# Patient Record
Sex: Female | Born: 1950 | Race: White | Hispanic: No | Marital: Married | Smoking: Never smoker
Health system: Southern US, Community
[De-identification: ages and names within clinical notes are randomized; demographics above are authoritative.]

## PROBLEM LIST (undated history)

## (undated) DIAGNOSIS — R112 Nausea with vomiting, unspecified: Secondary | ICD-10-CM

## (undated) DIAGNOSIS — R7303 Prediabetes: Secondary | ICD-10-CM

## (undated) DIAGNOSIS — D649 Anemia, unspecified: Secondary | ICD-10-CM

## (undated) DIAGNOSIS — K219 Gastro-esophageal reflux disease without esophagitis: Secondary | ICD-10-CM

## (undated) DIAGNOSIS — F329 Major depressive disorder, single episode, unspecified: Secondary | ICD-10-CM

## (undated) DIAGNOSIS — Z9889 Other specified postprocedural states: Secondary | ICD-10-CM

## (undated) DIAGNOSIS — J45909 Unspecified asthma, uncomplicated: Secondary | ICD-10-CM

## (undated) DIAGNOSIS — I10 Essential (primary) hypertension: Secondary | ICD-10-CM

## (undated) DIAGNOSIS — R011 Cardiac murmur, unspecified: Secondary | ICD-10-CM

## (undated) DIAGNOSIS — F419 Anxiety disorder, unspecified: Secondary | ICD-10-CM

## (undated) DIAGNOSIS — F32A Depression, unspecified: Secondary | ICD-10-CM

## (undated) DIAGNOSIS — Z87442 Personal history of urinary calculi: Secondary | ICD-10-CM

## (undated) DIAGNOSIS — M1990 Unspecified osteoarthritis, unspecified site: Secondary | ICD-10-CM

## (undated) DIAGNOSIS — J189 Pneumonia, unspecified organism: Secondary | ICD-10-CM

## (undated) HISTORY — PX: JOINT REPLACEMENT: SHX530

## (undated) HISTORY — PX: OTHER SURGICAL HISTORY: SHX169

## (undated) HISTORY — PX: TONSILLECTOMY: SUR1361

## (undated) HISTORY — PX: WRIST ARTHROPLASTY: SHX1088

## (undated) HISTORY — PX: ABDOMINAL HYSTERECTOMY: SHX81

---

## 1999-02-28 ENCOUNTER — Inpatient Hospital Stay (HOSPITAL_COMMUNITY): Admission: EM | Admit: 1999-02-28 | Discharge: 1999-02-28 | Payer: Self-pay | Admitting: *Deleted

## 2000-03-07 DIAGNOSIS — C801 Malignant (primary) neoplasm, unspecified: Secondary | ICD-10-CM

## 2000-03-07 HISTORY — DX: Malignant (primary) neoplasm, unspecified: C80.1

## 2001-03-07 DIAGNOSIS — R112 Nausea with vomiting, unspecified: Secondary | ICD-10-CM

## 2001-03-07 DIAGNOSIS — Z9889 Other specified postprocedural states: Secondary | ICD-10-CM

## 2001-03-07 HISTORY — PX: FRACTURE SURGERY: SHX138

## 2001-03-07 HISTORY — DX: Other specified postprocedural states: Z98.890

## 2001-03-07 HISTORY — DX: Nausea with vomiting, unspecified: R11.2

## 2008-11-03 ENCOUNTER — Inpatient Hospital Stay (HOSPITAL_COMMUNITY): Admission: RE | Admit: 2008-11-03 | Discharge: 2008-11-06 | Payer: Self-pay | Admitting: Orthopedic Surgery

## 2008-11-04 ENCOUNTER — Encounter (INDEPENDENT_AMBULATORY_CARE_PROVIDER_SITE_OTHER): Payer: Self-pay | Admitting: Orthopedic Surgery

## 2008-11-04 ENCOUNTER — Ambulatory Visit: Payer: Self-pay | Admitting: Vascular Surgery

## 2010-06-11 LAB — CBC
HCT: 28.6 % — ABNORMAL LOW (ref 36.0–46.0)
Hemoglobin: 9.5 g/dL — ABNORMAL LOW (ref 12.0–15.0)
MCV: 83.8 fL (ref 78.0–100.0)
Platelets: 172 10*3/uL (ref 150–400)
Platelets: 187 10*3/uL (ref 150–400)
WBC: 10.7 10*3/uL — ABNORMAL HIGH (ref 4.0–10.5)
WBC: 9.2 10*3/uL (ref 4.0–10.5)

## 2010-06-11 LAB — GLUCOSE, CAPILLARY
Glucose-Capillary: 148 mg/dL — ABNORMAL HIGH (ref 70–99)
Glucose-Capillary: 212 mg/dL — ABNORMAL HIGH (ref 70–99)

## 2010-06-11 LAB — BASIC METABOLIC PANEL
BUN: 6 mg/dL (ref 6–23)
BUN: 8 mg/dL (ref 6–23)
Chloride: 100 mEq/L (ref 96–112)
Creatinine, Ser: 0.78 mg/dL (ref 0.4–1.2)
GFR calc non Af Amer: >60 mL/min (ref >60)
Glucose, Bld: 103 mg/dL — ABNORMAL HIGH (ref 70–99)
Potassium: 3.4 mEq/L — ABNORMAL LOW (ref 3.5–5.1)
Sodium: 132 mEq/L — ABNORMAL LOW (ref 135–145)

## 2010-06-12 LAB — CROSSMATCH: Antibody Screen: NEGATIVE

## 2010-06-12 LAB — COMPREHENSIVE METABOLIC PANEL
ALT: 12 U/L (ref 0–35)
AST: 20 U/L (ref 0–37)
Alkaline Phosphatase: 75 U/L (ref 39–117)
CO2: 26 mEq/L (ref 19–32)
Chloride: 105 mEq/L (ref 96–112)
GFR calc Af Amer: >60 mL/min (ref >60)
GFR calc non Af Amer: >60 mL/min (ref >60)
Glucose, Bld: 103 mg/dL — ABNORMAL HIGH (ref 70–99)
Potassium: 4.1 mEq/L (ref 3.5–5.1)
Sodium: 138 mEq/L (ref 135–145)
Total Bilirubin: 0.4 mg/dL (ref 0.3–1.2)

## 2010-06-12 LAB — URINALYSIS, ROUTINE W REFLEX MICROSCOPIC
Glucose, UA: NEGATIVE mg/dL
Hgb urine dipstick: NEGATIVE
Protein, ur: NEGATIVE mg/dL
Urobilinogen, UA: 0.2 mg/dL (ref 0.0–1.0)

## 2010-06-12 LAB — DIFFERENTIAL
Basophils Relative: 2 % — ABNORMAL HIGH (ref 0–1)
Eosinophils Absolute: 0.3 10*3/uL (ref 0.0–0.7)
Eosinophils Relative: 5 % (ref 0–5)
Neutrophils Relative %: 65 % (ref 43–77)

## 2010-06-12 LAB — CBC
HCT: 32 % — ABNORMAL LOW (ref 36.0–46.0)
Hemoglobin: 10.6 g/dL — ABNORMAL LOW (ref 12.0–15.0)
Hemoglobin: 12.3 g/dL (ref 12.0–15.0)
MCHC: 33.6 g/dL (ref 30.0–36.0)
MCV: 84 fL (ref 78.0–100.0)
Platelets: 186 10*3/uL (ref 150–400)
RBC: 4.38 MIL/uL (ref 3.87–5.11)
WBC: 11.7 10*3/uL — ABNORMAL HIGH (ref 4.0–10.5)
WBC: 7.1 10*3/uL (ref 4.0–10.5)

## 2010-06-12 LAB — GLUCOSE, CAPILLARY
Glucose-Capillary: 107 mg/dL — ABNORMAL HIGH (ref 70–99)
Glucose-Capillary: 108 mg/dL — ABNORMAL HIGH (ref 70–99)
Glucose-Capillary: 127 mg/dL — ABNORMAL HIGH (ref 70–99)

## 2010-06-12 LAB — PROTIME-INR
INR: 0.9 (ref 0.00–1.49)
Prothrombin Time: 12.2 seconds (ref 11.6–15.2)

## 2010-06-12 LAB — URINE CULTURE

## 2010-06-12 LAB — BASIC METABOLIC PANEL
BUN: 10 mg/dL (ref 6–23)
Chloride: 100 mEq/L (ref 96–112)
GFR calc non Af Amer: >60 mL/min (ref >60)
Glucose, Bld: 146 mg/dL — ABNORMAL HIGH (ref 70–99)
Potassium: 3.7 mEq/L (ref 3.5–5.1)
Sodium: 134 mEq/L — ABNORMAL LOW (ref 135–145)

## 2010-07-20 NOTE — Consult Note (Signed)
NAMEIQRA, [name]               ACCOUNT NO.:  [id number]   MEDICAL RECORD NO.:  [id number]          PATIENT TYPE:  INP   LOCATION:  5006                         FACILITY:  [facility]   PHYSICIAN:  Talmage Nap, MD  DATE OF BIRTH:  [birth year]   DATE OF CONSULTATION:  11/05/2008  DATE OF DISCHARGE:                                 CONSULTATION   REASON FOR CONSULTATION:  Altered mental status and hypoxia requested by  Dr. Sherlean Foot of orthopedics.   HISTORY OF PRESENT ILLNESS:  [name] is a 60 year old female with  past medical history of degenerative joint disease, hypertension,  depression, asthma, allergies, and diabetes type 2 diet controlled.  She  was status post left total knee arthroplasty on November 03, 2008.  Today,  she was noted to be confused and hypoxic with pox of 86% on room air.  A  CT angio of the chest has been ordered. Heather was consulted for further  evaluation and treatment.   ALLERGIES:  No known drug allergies.   PAST MEDICAL HISTORY:  1. Degenerative joint disease.  2. Hypertension.  3. Depression.  4. Asthma.  5. Allergies.  6. Diabetes type 2, diet controlled.   PAST SURGICAL HISTORY:  1. Tonsillectomy.  2. Eye surgery x3.   SOCIAL HISTORY:  She is married, she has no children. She reports never  smoked and very rare alcohol use.   FAMILY HISTORY:  Mother is deceased secondary to pneumonia.  She also  had history of asthma and heart arrhythmia.  Dad is deceased and had  mental health problems and also some respiratory problems.   MEDICATIONS:  1. Tylenol 1000 mg p.o. q.6 h.  2. Colace 100 mg p.o. b.Heather.d.  3. Doxepin 50 mg p.o. at bedtime.  4. Cymbalta 60 mg p.o. daily.  5. Lovenox 30 mg subcu q.12 h.  6. Lisinopril 5 mg p.o. at bedtime.  7. Loratadine 10 mg p.o. at bedtime.  8. Ativan 3 mg p.o. at bedtime and 1 mg p.o. daily in the morning.  9. Oxycodone 20 mg p.o. b.Heather.d.  10.Protonix 40 mg p.o. at bedtime.  11.Peridium 100 mg p.o. t.Heather.d.  12.Zocor 20 mg p.o. daily.  13.Aluminum hydroxide suspension 15-30 mL p.o. q.4 h. p.r.n.  14.Tessalon Perles 100 mg p.o. daily p.r.n.  15.Dulcolax 10 mg p.o. daily p.r.n.  16.Chloraseptic spray p.r.n.  17.Diphenhydramine 25 mg p.o. at bedtime p.r.n.  18.Guaifenesin 600 mg p.o. b.Heather.d. p.r.n.  19.Hydromorphone 0.5 to 1 mg IV q.2 h. p.r.n.  20.Cepacol 1 tab p.o. p.r.n.  21.Robaxin 500 mg p.o. q.6 h. p.r.n.  22.Reglan 10 mg p.o. or IV q.6 h. p.r.n.  23.Zofran 4 mg IV or p.o. q.6 h. p.r.n.  24.Oxycodone 5-10 mg two q.3 h. p.r.n.  25.Ambien 5 mg p.o. at bedtime p.r.n.  26.Fleet 133 mL daily p.r.n.   REVIEW OF SYSTEMS:  GENERAL:  No chills.  No sweats.  CARDIOVASCULAR:  No chest pain.  No palpitations.  RESPIRATORY:  Denies shortness of  breath or cough.  GI:  Nausea without vomiting.  GU:  Denies dysuria.  Currently on Cipro for  UTI.  MUSCULOSKELETAL:  Left knee notes pain.  PSYCHIATRIC:  She has history of depression and anxiety, which she feels  is well controlled with current medications.  NEURO:  She notes chronic  blurred vision in left eye which is currently at baseline.  SKIN:  No  rashes.  ENT:  Positive nasal congestion.  Does have soar throat since  surgery intubation.   LABORATORY DATA/RADIOLOGY:  Sodium 135, potassium 4.0, chloride 100,  bicarb 29, BUN 8, creatinine 0.78, glucose 141.  Hemoglobin 9.8,  hematocrit 38, white blood cell count 10.7, platelets 172.   PHYSICAL EXAMINATION:  VITAL SIGNS:  BP 112/72, heart rate 102,  respiratory rate 18, temp 99.2, O2 sat 86% on room air.  HEENT:  Head is normocephalic and atraumatic.  GENERAL:  Awake but drowsy.  No acute distress.  CARDIOVASCULAR:  S1 and S2.  Regular rate and rhythm.  No lower  extremity edema.  RESPIRATORY:  Breath sounds to auscultation bilaterally but diminished  at the bases.  GI:  Abdomen is soft, nontender, nondistended.  Positive bowel sounds  are noted.  NEURO:  She is moving all extremities.  She has  positive facial  symmetry.  Speech is clear.  MUSCULOSKELETAL:  Her left knee dressing is clean, dry, and intact.  She  is wearing bilateral TED hose.  PSYCHIATRIC:  She is oriented to person.  She is not oriented to place  or year.   ASSESSMENT AND PLAN:  1. Altered mental status with hypoxia, need to rule out pulmonary      embolism as well as possibility of underlying pneumonia.  CT angio      of the chest is currently pending.  We will start IV heparin      empirically with plan to discontinue if CT angio is negative.      Titrate O2 to keep sats greater than or equal to 92%.  We will plan      to change Ativan to p.r.n. secondary to sedation.  2. Hypertension.  Her blood pressure is stable.  Continue home meds.  3. 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  Hemoglobin is 12.3      preop, down to 9.8 postoperative and plan is to monitor hemoglobin      level.  4. Hyperglycemia.  The patient reports history of diet-controlled      diabetes type 2.  We will check A1c, add sliding scale coverage.  5-Thank you for the consult.      Sandford Craze, NP      Talmage Nap, MD  Electronically Signed    MO/MEDQ  D:  11/05/2008  T:  11/06/2008  Job:  [id number]   cc:   Heather Leach, PA-C

## 2010-07-20 NOTE — Op Note (Signed)
[name], [name]               ACCOUNT NO.:  [id number]   MEDICAL RECORD NO.:  [id number]          PATIENT TYPE:  INP   LOCATION:  5006                         FACILITY:  [facility]   PHYSICIAN:  Mila Homer. Sherlean Foot, M.D. DATE OF BIRTH:  Nov 18, 1950   DATE OF PROCEDURE:  11/03/2008  DATE OF DISCHARGE:                               OPERATIVE REPORT   SURGEON:  Mila Homer. Sherlean Foot, MD   ASSISTANTS:  Altamese Cabal, PA-C and Skip Mayer, PA-C   ANESTHESIA:  General.   PREOPERATIVE DIAGNOSIS:  Left knee osteoarthritis.   POSTOPERATIVE DIAGNOSIS:  Left knee osteoarthritis.   PROCEDURE:  Left total knee arthroplasty.   INDICATIONS FOR PROCEDURE:  The patient is a 60 year old white female  with failure of conservative measures for left knee osteoarthritis.  Informed consent was obtained.   DESCRIPTION OF PROCEDURE:  The patient was laid supine, administered  general anesthesia, and Foley catheter was placed.  Left leg was prepped  and draped in usual sterile fashion.  The extremity was exsanguinated  with the Esmarch and the tourniquet inflated to 350 mmHg and set for an  hour.  Midline incision was made with a #10 blade.  New blade was used  make a median parapatellar arthrotomy and performed a synovectomy.  I  everted the patella, it measured 21 mm thick.  I reamed down to 12 mm,  drilled through lug holes with the 32-mm template and with prosthetic  trial in place, I recreated the 21-mm thickness.  I removed the button,  went into flexion.  I used the extramedullary alignment system on the  tibia to make a perpendicular cut to the anatomic axis of the tibia.  I  then used the intramedullary guide on the femur to make a 6-degree  valgus cut since this was a varus knee.  I removed this cutting block  and drew out the epicondylar axis.  Posterior condylar angle measured 3  degrees.  I then sized to size C, pinned through the 3-degree external  rotation holes matching the posterior condylar  angle.  I then placed a  lamina spreader in the knee, removed the ACL, PCL, medial and lateral  menisci, posterior condylar osteophytes.  I then placed a 10-mm spacer  block in the knee and had good flexion/extension gap balance after  releasing the semimembranosus tendon.  I then finished the femur with a  size C femoral finishing block, cutting through the lugs and the box.  I  then used a size 3 tibial tray drilling keel.  I then trialed with a 3  tibia C femur, 10 Insert, 32 patella, and had good flexion/extension gap  balance and excellent patellar tracking.  I then removed the trial  components and copiously irrigated.  I then cemented in the components  removing all excess cement and allowing the cement to harden in  extension.  I placed a Hemovac coming out superolaterally and deep to  the arthrotomy, and a pain catheter coming out superomedially and  superficially to the arthrotomy.  Infiltrated the capsule with 0.25%  Marcaine with epinephrine.  Closed the arthrotomy  after obtaining  hemostasis and irrigating.  I closed the arthrotomy with figure-of-eight  #1 Vicryl sutures, deep soft tissue with buried with 0 Vicryl sutures,  subcuticular 2-0 Vicryl stitch and skin staples.   COMPLICATIONS:  None.   DRAINS:  One Hemovac, one pain catheter.   ESTIMATED BLOOD LOSS:  300 mL.   TOURNIQUET TIME:  48 minutes.           ______________________________  Mila Homer Sherlean Foot, M.D.     SDL/MEDQ  D:  11/03/2008  T:  11/04/2008  Job:  [id number]

## 2011-04-20 DIAGNOSIS — M79609 Pain in unspecified limb: Secondary | ICD-10-CM | POA: Diagnosis not present

## 2011-04-20 DIAGNOSIS — M19049 Primary osteoarthritis, unspecified hand: Secondary | ICD-10-CM | POA: Diagnosis not present

## 2011-04-20 DIAGNOSIS — G5600 Carpal tunnel syndrome, unspecified upper limb: Secondary | ICD-10-CM | POA: Diagnosis not present

## 2011-04-20 DIAGNOSIS — R209 Unspecified disturbances of skin sensation: Secondary | ICD-10-CM | POA: Diagnosis not present

## 2011-04-21 DIAGNOSIS — Z79899 Other long term (current) drug therapy: Secondary | ICD-10-CM | POA: Diagnosis not present

## 2011-04-21 DIAGNOSIS — E7800 Pure hypercholesterolemia, unspecified: Secondary | ICD-10-CM | POA: Diagnosis not present

## 2011-04-21 DIAGNOSIS — E119 Type 2 diabetes mellitus without complications: Secondary | ICD-10-CM | POA: Diagnosis not present

## 2011-05-24 DIAGNOSIS — M84369A Stress fracture, unspecified tibia and fibula, initial encounter for fracture: Secondary | ICD-10-CM | POA: Diagnosis not present

## 2011-05-24 DIAGNOSIS — M949 Disorder of cartilage, unspecified: Secondary | ICD-10-CM | POA: Diagnosis not present

## 2011-05-24 DIAGNOSIS — R269 Unspecified abnormalities of gait and mobility: Secondary | ICD-10-CM | POA: Diagnosis not present

## 2011-05-24 DIAGNOSIS — M899 Disorder of bone, unspecified: Secondary | ICD-10-CM | POA: Diagnosis not present

## 2011-05-24 DIAGNOSIS — M25579 Pain in unspecified ankle and joints of unspecified foot: Secondary | ICD-10-CM | POA: Diagnosis not present

## 2011-06-06 DIAGNOSIS — F333 Major depressive disorder, recurrent, severe with psychotic symptoms: Secondary | ICD-10-CM | POA: Diagnosis not present

## 2011-06-10 DIAGNOSIS — M84369A Stress fracture, unspecified tibia and fibula, initial encounter for fracture: Secondary | ICD-10-CM | POA: Diagnosis not present

## 2011-06-16 DIAGNOSIS — M84369A Stress fracture, unspecified tibia and fibula, initial encounter for fracture: Secondary | ICD-10-CM | POA: Diagnosis not present

## 2011-06-21 DIAGNOSIS — N3941 Urge incontinence: Secondary | ICD-10-CM | POA: Diagnosis not present

## 2011-06-21 DIAGNOSIS — N318 Other neuromuscular dysfunction of bladder: Secondary | ICD-10-CM | POA: Diagnosis not present

## 2011-06-21 DIAGNOSIS — N390 Urinary tract infection, site not specified: Secondary | ICD-10-CM | POA: Diagnosis not present

## 2011-06-27 DIAGNOSIS — J45901 Unspecified asthma with (acute) exacerbation: Secondary | ICD-10-CM | POA: Diagnosis not present

## 2011-06-27 DIAGNOSIS — R509 Fever, unspecified: Secondary | ICD-10-CM | POA: Diagnosis not present

## 2011-07-01 DIAGNOSIS — M899 Disorder of bone, unspecified: Secondary | ICD-10-CM | POA: Diagnosis not present

## 2011-07-01 DIAGNOSIS — M949 Disorder of cartilage, unspecified: Secondary | ICD-10-CM | POA: Diagnosis not present

## 2011-07-01 DIAGNOSIS — M810 Age-related osteoporosis without current pathological fracture: Secondary | ICD-10-CM | POA: Diagnosis not present

## 2011-07-07 DIAGNOSIS — Z1231 Encounter for screening mammogram for malignant neoplasm of breast: Secondary | ICD-10-CM | POA: Diagnosis not present

## 2011-07-08 DIAGNOSIS — M84369A Stress fracture, unspecified tibia and fibula, initial encounter for fracture: Secondary | ICD-10-CM | POA: Diagnosis not present

## 2011-07-08 DIAGNOSIS — M25579 Pain in unspecified ankle and joints of unspecified foot: Secondary | ICD-10-CM | POA: Diagnosis not present

## 2011-07-08 DIAGNOSIS — R269 Unspecified abnormalities of gait and mobility: Secondary | ICD-10-CM | POA: Diagnosis not present

## 2011-07-12 DIAGNOSIS — J209 Acute bronchitis, unspecified: Secondary | ICD-10-CM | POA: Diagnosis not present

## 2011-07-21 DIAGNOSIS — M810 Age-related osteoporosis without current pathological fracture: Secondary | ICD-10-CM | POA: Diagnosis not present

## 2011-07-26 DIAGNOSIS — F333 Major depressive disorder, recurrent, severe with psychotic symptoms: Secondary | ICD-10-CM | POA: Diagnosis not present

## 2011-08-02 DIAGNOSIS — M899 Disorder of bone, unspecified: Secondary | ICD-10-CM | POA: Diagnosis not present

## 2011-08-02 DIAGNOSIS — M949 Disorder of cartilage, unspecified: Secondary | ICD-10-CM | POA: Diagnosis not present

## 2011-08-11 DIAGNOSIS — R5381 Other malaise: Secondary | ICD-10-CM | POA: Diagnosis not present

## 2011-08-11 DIAGNOSIS — J309 Allergic rhinitis, unspecified: Secondary | ICD-10-CM | POA: Diagnosis not present

## 2011-08-11 DIAGNOSIS — R509 Fever, unspecified: Secondary | ICD-10-CM | POA: Diagnosis not present

## 2011-08-11 DIAGNOSIS — R5383 Other fatigue: Secondary | ICD-10-CM | POA: Diagnosis not present

## 2011-08-15 DIAGNOSIS — J019 Acute sinusitis, unspecified: Secondary | ICD-10-CM | POA: Diagnosis not present

## 2011-09-21 DIAGNOSIS — J45909 Unspecified asthma, uncomplicated: Secondary | ICD-10-CM | POA: Diagnosis not present

## 2011-09-21 DIAGNOSIS — J309 Allergic rhinitis, unspecified: Secondary | ICD-10-CM | POA: Diagnosis not present

## 2011-09-21 DIAGNOSIS — K146 Glossodynia: Secondary | ICD-10-CM | POA: Diagnosis not present

## 2011-09-29 DIAGNOSIS — N390 Urinary tract infection, site not specified: Secondary | ICD-10-CM | POA: Diagnosis not present

## 2011-09-29 DIAGNOSIS — N3941 Urge incontinence: Secondary | ICD-10-CM | POA: Diagnosis not present

## 2011-09-29 DIAGNOSIS — N318 Other neuromuscular dysfunction of bladder: Secondary | ICD-10-CM | POA: Diagnosis not present

## 2011-10-13 DIAGNOSIS — L719 Rosacea, unspecified: Secondary | ICD-10-CM | POA: Diagnosis not present

## 2011-10-13 DIAGNOSIS — L219 Seborrheic dermatitis, unspecified: Secondary | ICD-10-CM | POA: Diagnosis not present

## 2011-10-13 DIAGNOSIS — L570 Actinic keratosis: Secondary | ICD-10-CM | POA: Diagnosis not present

## 2011-10-25 DIAGNOSIS — F333 Major depressive disorder, recurrent, severe with psychotic symptoms: Secondary | ICD-10-CM | POA: Diagnosis not present

## 2011-10-31 DIAGNOSIS — J309 Allergic rhinitis, unspecified: Secondary | ICD-10-CM | POA: Diagnosis not present

## 2011-11-04 DIAGNOSIS — M5137 Other intervertebral disc degeneration, lumbosacral region: Secondary | ICD-10-CM | POA: Diagnosis not present

## 2011-11-04 DIAGNOSIS — M25569 Pain in unspecified knee: Secondary | ICD-10-CM | POA: Diagnosis not present

## 2011-11-04 DIAGNOSIS — Z96659 Presence of unspecified artificial knee joint: Secondary | ICD-10-CM | POA: Diagnosis not present

## 2011-11-04 DIAGNOSIS — IMO0002 Reserved for concepts with insufficient information to code with codable children: Secondary | ICD-10-CM | POA: Diagnosis not present

## 2011-11-17 DIAGNOSIS — J45909 Unspecified asthma, uncomplicated: Secondary | ICD-10-CM | POA: Diagnosis not present

## 2011-11-17 DIAGNOSIS — H1045 Other chronic allergic conjunctivitis: Secondary | ICD-10-CM | POA: Diagnosis not present

## 2011-11-17 DIAGNOSIS — J309 Allergic rhinitis, unspecified: Secondary | ICD-10-CM | POA: Diagnosis not present

## 2011-12-06 DIAGNOSIS — Z23 Encounter for immunization: Secondary | ICD-10-CM | POA: Diagnosis not present

## 2011-12-16 DIAGNOSIS — Z96659 Presence of unspecified artificial knee joint: Secondary | ICD-10-CM | POA: Diagnosis not present

## 2011-12-16 DIAGNOSIS — M25569 Pain in unspecified knee: Secondary | ICD-10-CM | POA: Diagnosis not present

## 2012-01-09 DIAGNOSIS — L981 Factitial dermatitis: Secondary | ICD-10-CM | POA: Diagnosis not present

## 2012-01-09 DIAGNOSIS — L219 Seborrheic dermatitis, unspecified: Secondary | ICD-10-CM | POA: Diagnosis not present

## 2012-02-24 DIAGNOSIS — I10 Essential (primary) hypertension: Secondary | ICD-10-CM | POA: Diagnosis not present

## 2012-02-24 DIAGNOSIS — E782 Mixed hyperlipidemia: Secondary | ICD-10-CM | POA: Diagnosis not present

## 2012-02-24 DIAGNOSIS — N183 Chronic kidney disease, stage 3 unspecified: Secondary | ICD-10-CM | POA: Diagnosis not present

## 2012-02-24 DIAGNOSIS — E119 Type 2 diabetes mellitus without complications: Secondary | ICD-10-CM | POA: Diagnosis not present

## 2012-03-12 DIAGNOSIS — L219 Seborrheic dermatitis, unspecified: Secondary | ICD-10-CM | POA: Diagnosis not present

## 2012-03-12 DIAGNOSIS — L981 Factitial dermatitis: Secondary | ICD-10-CM | POA: Diagnosis not present

## 2012-03-20 DIAGNOSIS — M75100 Unspecified rotator cuff tear or rupture of unspecified shoulder, not specified as traumatic: Secondary | ICD-10-CM | POA: Diagnosis not present

## 2012-03-20 DIAGNOSIS — M25519 Pain in unspecified shoulder: Secondary | ICD-10-CM | POA: Diagnosis not present

## 2012-03-20 DIAGNOSIS — IMO0002 Reserved for concepts with insufficient information to code with codable children: Secondary | ICD-10-CM | POA: Diagnosis not present

## 2012-03-25 DIAGNOSIS — J40 Bronchitis, not specified as acute or chronic: Secondary | ICD-10-CM | POA: Diagnosis not present

## 2012-03-25 DIAGNOSIS — J45909 Unspecified asthma, uncomplicated: Secondary | ICD-10-CM | POA: Diagnosis not present

## 2012-03-28 DIAGNOSIS — J209 Acute bronchitis, unspecified: Secondary | ICD-10-CM | POA: Diagnosis not present

## 2012-04-12 DIAGNOSIS — L408 Other psoriasis: Secondary | ICD-10-CM | POA: Diagnosis not present

## 2012-04-12 DIAGNOSIS — L4050 Arthropathic psoriasis, unspecified: Secondary | ICD-10-CM | POA: Diagnosis not present

## 2012-04-17 DIAGNOSIS — L439 Lichen planus, unspecified: Secondary | ICD-10-CM | POA: Diagnosis not present

## 2012-04-17 DIAGNOSIS — Z111 Encounter for screening for respiratory tuberculosis: Secondary | ICD-10-CM | POA: Diagnosis not present

## 2012-05-09 DIAGNOSIS — F333 Major depressive disorder, recurrent, severe with psychotic symptoms: Secondary | ICD-10-CM | POA: Diagnosis not present

## 2012-05-24 DIAGNOSIS — S92309A Fracture of unspecified metatarsal bone(s), unspecified foot, initial encounter for closed fracture: Secondary | ICD-10-CM | POA: Diagnosis not present

## 2012-05-31 DIAGNOSIS — S92309A Fracture of unspecified metatarsal bone(s), unspecified foot, initial encounter for closed fracture: Secondary | ICD-10-CM | POA: Diagnosis not present

## 2012-06-06 DIAGNOSIS — L408 Other psoriasis: Secondary | ICD-10-CM | POA: Diagnosis not present

## 2012-06-08 DIAGNOSIS — S92309A Fracture of unspecified metatarsal bone(s), unspecified foot, initial encounter for closed fracture: Secondary | ICD-10-CM | POA: Diagnosis not present

## 2012-06-29 DIAGNOSIS — S92309A Fracture of unspecified metatarsal bone(s), unspecified foot, initial encounter for closed fracture: Secondary | ICD-10-CM | POA: Diagnosis not present

## 2012-07-16 DIAGNOSIS — Z9181 History of falling: Secondary | ICD-10-CM | POA: Diagnosis not present

## 2012-07-16 DIAGNOSIS — Z6835 Body mass index (BMI) 35.0-35.9, adult: Secondary | ICD-10-CM | POA: Diagnosis not present

## 2012-07-16 DIAGNOSIS — Z1331 Encounter for screening for depression: Secondary | ICD-10-CM | POA: Diagnosis not present

## 2012-07-16 DIAGNOSIS — L03019 Cellulitis of unspecified finger: Secondary | ICD-10-CM | POA: Diagnosis not present

## 2012-07-18 DIAGNOSIS — L4050 Arthropathic psoriasis, unspecified: Secondary | ICD-10-CM | POA: Diagnosis not present

## 2012-07-18 DIAGNOSIS — L408 Other psoriasis: Secondary | ICD-10-CM | POA: Diagnosis not present

## 2012-07-20 DIAGNOSIS — Z1231 Encounter for screening mammogram for malignant neoplasm of breast: Secondary | ICD-10-CM | POA: Diagnosis not present

## 2012-07-23 DIAGNOSIS — Z0000 Encounter for general adult medical examination without abnormal findings: Secondary | ICD-10-CM | POA: Diagnosis not present

## 2012-07-23 DIAGNOSIS — I10 Essential (primary) hypertension: Secondary | ICD-10-CM | POA: Diagnosis not present

## 2012-07-23 DIAGNOSIS — E119 Type 2 diabetes mellitus without complications: Secondary | ICD-10-CM | POA: Diagnosis not present

## 2012-07-23 DIAGNOSIS — Z6836 Body mass index (BMI) 36.0-36.9, adult: Secondary | ICD-10-CM | POA: Diagnosis not present

## 2012-07-23 DIAGNOSIS — E782 Mixed hyperlipidemia: Secondary | ICD-10-CM | POA: Diagnosis not present

## 2012-07-25 DIAGNOSIS — M545 Low back pain, unspecified: Secondary | ICD-10-CM | POA: Diagnosis not present

## 2012-07-27 DIAGNOSIS — S92309A Fracture of unspecified metatarsal bone(s), unspecified foot, initial encounter for closed fracture: Secondary | ICD-10-CM | POA: Diagnosis not present

## 2012-08-09 DIAGNOSIS — L02818 Cutaneous abscess of other sites: Secondary | ICD-10-CM | POA: Diagnosis not present

## 2012-08-24 DIAGNOSIS — M899 Disorder of bone, unspecified: Secondary | ICD-10-CM | POA: Diagnosis not present

## 2012-08-31 DIAGNOSIS — M25579 Pain in unspecified ankle and joints of unspecified foot: Secondary | ICD-10-CM | POA: Diagnosis not present

## 2012-08-31 DIAGNOSIS — S92309A Fracture of unspecified metatarsal bone(s), unspecified foot, initial encounter for closed fracture: Secondary | ICD-10-CM | POA: Diagnosis not present

## 2012-09-17 DIAGNOSIS — L570 Actinic keratosis: Secondary | ICD-10-CM | POA: Diagnosis not present

## 2012-09-17 DIAGNOSIS — L408 Other psoriasis: Secondary | ICD-10-CM | POA: Diagnosis not present

## 2012-09-20 DIAGNOSIS — J45909 Unspecified asthma, uncomplicated: Secondary | ICD-10-CM | POA: Diagnosis not present

## 2012-09-20 DIAGNOSIS — J309 Allergic rhinitis, unspecified: Secondary | ICD-10-CM | POA: Diagnosis not present

## 2012-09-20 DIAGNOSIS — M25579 Pain in unspecified ankle and joints of unspecified foot: Secondary | ICD-10-CM | POA: Diagnosis not present

## 2012-09-25 DIAGNOSIS — R05 Cough: Secondary | ICD-10-CM | POA: Diagnosis not present

## 2012-09-25 DIAGNOSIS — R059 Cough, unspecified: Secondary | ICD-10-CM | POA: Diagnosis not present

## 2012-10-08 DIAGNOSIS — M25673 Stiffness of unspecified ankle, not elsewhere classified: Secondary | ICD-10-CM | POA: Diagnosis not present

## 2012-10-12 DIAGNOSIS — M25676 Stiffness of unspecified foot, not elsewhere classified: Secondary | ICD-10-CM | POA: Diagnosis not present

## 2012-10-12 DIAGNOSIS — M25673 Stiffness of unspecified ankle, not elsewhere classified: Secondary | ICD-10-CM | POA: Diagnosis not present

## 2012-10-16 DIAGNOSIS — J209 Acute bronchitis, unspecified: Secondary | ICD-10-CM | POA: Diagnosis not present

## 2012-10-19 DIAGNOSIS — J209 Acute bronchitis, unspecified: Secondary | ICD-10-CM | POA: Diagnosis not present

## 2012-10-23 DIAGNOSIS — J019 Acute sinusitis, unspecified: Secondary | ICD-10-CM | POA: Diagnosis not present

## 2012-10-31 DIAGNOSIS — J45909 Unspecified asthma, uncomplicated: Secondary | ICD-10-CM | POA: Diagnosis not present

## 2012-10-31 DIAGNOSIS — J309 Allergic rhinitis, unspecified: Secondary | ICD-10-CM | POA: Diagnosis not present

## 2012-11-06 DIAGNOSIS — F333 Major depressive disorder, recurrent, severe with psychotic symptoms: Secondary | ICD-10-CM | POA: Diagnosis not present

## 2012-11-19 DIAGNOSIS — N3941 Urge incontinence: Secondary | ICD-10-CM | POA: Diagnosis not present

## 2012-11-19 DIAGNOSIS — J449 Chronic obstructive pulmonary disease, unspecified: Secondary | ICD-10-CM | POA: Diagnosis not present

## 2012-11-19 DIAGNOSIS — I10 Essential (primary) hypertension: Secondary | ICD-10-CM | POA: Diagnosis not present

## 2012-11-19 DIAGNOSIS — N318 Other neuromuscular dysfunction of bladder: Secondary | ICD-10-CM | POA: Diagnosis not present

## 2012-11-29 DIAGNOSIS — I10 Essential (primary) hypertension: Secondary | ICD-10-CM | POA: Diagnosis not present

## 2012-11-29 DIAGNOSIS — IMO0001 Reserved for inherently not codable concepts without codable children: Secondary | ICD-10-CM | POA: Diagnosis not present

## 2012-11-29 DIAGNOSIS — E782 Mixed hyperlipidemia: Secondary | ICD-10-CM | POA: Diagnosis not present

## 2012-11-29 DIAGNOSIS — E119 Type 2 diabetes mellitus without complications: Secondary | ICD-10-CM | POA: Diagnosis not present

## 2012-12-19 DIAGNOSIS — J309 Allergic rhinitis, unspecified: Secondary | ICD-10-CM | POA: Diagnosis not present

## 2012-12-19 DIAGNOSIS — J45909 Unspecified asthma, uncomplicated: Secondary | ICD-10-CM | POA: Diagnosis not present

## 2012-12-24 DIAGNOSIS — B372 Candidiasis of skin and nail: Secondary | ICD-10-CM | POA: Diagnosis not present

## 2012-12-28 DIAGNOSIS — M25519 Pain in unspecified shoulder: Secondary | ICD-10-CM | POA: Diagnosis not present

## 2013-01-01 DIAGNOSIS — N390 Urinary tract infection, site not specified: Secondary | ICD-10-CM | POA: Diagnosis not present

## 2013-01-01 DIAGNOSIS — J449 Chronic obstructive pulmonary disease, unspecified: Secondary | ICD-10-CM | POA: Diagnosis not present

## 2013-01-01 DIAGNOSIS — N318 Other neuromuscular dysfunction of bladder: Secondary | ICD-10-CM | POA: Diagnosis not present

## 2013-01-01 DIAGNOSIS — N3941 Urge incontinence: Secondary | ICD-10-CM | POA: Diagnosis not present

## 2013-01-03 DIAGNOSIS — N390 Urinary tract infection, site not specified: Secondary | ICD-10-CM | POA: Diagnosis not present

## 2013-01-10 DIAGNOSIS — J449 Chronic obstructive pulmonary disease, unspecified: Secondary | ICD-10-CM | POA: Diagnosis not present

## 2013-01-10 DIAGNOSIS — I10 Essential (primary) hypertension: Secondary | ICD-10-CM | POA: Diagnosis not present

## 2013-01-10 DIAGNOSIS — N390 Urinary tract infection, site not specified: Secondary | ICD-10-CM | POA: Diagnosis not present

## 2013-01-10 DIAGNOSIS — N3941 Urge incontinence: Secondary | ICD-10-CM | POA: Diagnosis not present

## 2013-02-18 DIAGNOSIS — N390 Urinary tract infection, site not specified: Secondary | ICD-10-CM | POA: Diagnosis not present

## 2013-02-18 DIAGNOSIS — N3941 Urge incontinence: Secondary | ICD-10-CM | POA: Diagnosis not present

## 2013-02-18 DIAGNOSIS — N318 Other neuromuscular dysfunction of bladder: Secondary | ICD-10-CM | POA: Diagnosis not present

## 2013-02-19 DIAGNOSIS — J209 Acute bronchitis, unspecified: Secondary | ICD-10-CM | POA: Diagnosis not present

## 2013-02-19 DIAGNOSIS — J019 Acute sinusitis, unspecified: Secondary | ICD-10-CM | POA: Diagnosis not present

## 2013-03-11 DIAGNOSIS — Z6834 Body mass index (BMI) 34.0-34.9, adult: Secondary | ICD-10-CM | POA: Diagnosis not present

## 2013-03-11 DIAGNOSIS — J45909 Unspecified asthma, uncomplicated: Secondary | ICD-10-CM | POA: Diagnosis not present

## 2013-03-19 DIAGNOSIS — N3941 Urge incontinence: Secondary | ICD-10-CM | POA: Diagnosis not present

## 2013-03-19 DIAGNOSIS — N390 Urinary tract infection, site not specified: Secondary | ICD-10-CM | POA: Diagnosis not present

## 2013-03-19 DIAGNOSIS — N318 Other neuromuscular dysfunction of bladder: Secondary | ICD-10-CM | POA: Diagnosis not present

## 2013-03-25 DIAGNOSIS — N3941 Urge incontinence: Secondary | ICD-10-CM | POA: Diagnosis not present

## 2013-03-25 DIAGNOSIS — I10 Essential (primary) hypertension: Secondary | ICD-10-CM | POA: Diagnosis not present

## 2013-03-25 DIAGNOSIS — J449 Chronic obstructive pulmonary disease, unspecified: Secondary | ICD-10-CM | POA: Diagnosis not present

## 2013-03-25 DIAGNOSIS — N390 Urinary tract infection, site not specified: Secondary | ICD-10-CM | POA: Diagnosis not present

## 2013-03-26 DIAGNOSIS — M899 Disorder of bone, unspecified: Secondary | ICD-10-CM | POA: Diagnosis not present

## 2013-03-26 DIAGNOSIS — M949 Disorder of cartilage, unspecified: Secondary | ICD-10-CM | POA: Diagnosis not present

## 2013-03-28 DIAGNOSIS — J019 Acute sinusitis, unspecified: Secondary | ICD-10-CM | POA: Diagnosis not present

## 2013-04-24 DIAGNOSIS — S82409A Unspecified fracture of shaft of unspecified fibula, initial encounter for closed fracture: Secondary | ICD-10-CM | POA: Diagnosis not present

## 2013-05-09 DIAGNOSIS — S82409A Unspecified fracture of shaft of unspecified fibula, initial encounter for closed fracture: Secondary | ICD-10-CM | POA: Diagnosis not present

## 2013-05-21 DIAGNOSIS — M2020 Hallux rigidus, unspecified foot: Secondary | ICD-10-CM | POA: Diagnosis not present

## 2013-05-21 DIAGNOSIS — B351 Tinea unguium: Secondary | ICD-10-CM | POA: Diagnosis not present

## 2013-05-21 DIAGNOSIS — L608 Other nail disorders: Secondary | ICD-10-CM | POA: Diagnosis not present

## 2013-05-27 DIAGNOSIS — E119 Type 2 diabetes mellitus without complications: Secondary | ICD-10-CM | POA: Diagnosis not present

## 2013-05-27 DIAGNOSIS — I10 Essential (primary) hypertension: Secondary | ICD-10-CM | POA: Diagnosis not present

## 2013-05-27 DIAGNOSIS — E782 Mixed hyperlipidemia: Secondary | ICD-10-CM | POA: Diagnosis not present

## 2013-05-30 DIAGNOSIS — L4050 Arthropathic psoriasis, unspecified: Secondary | ICD-10-CM | POA: Diagnosis not present

## 2013-05-30 DIAGNOSIS — L408 Other psoriasis: Secondary | ICD-10-CM | POA: Diagnosis not present

## 2013-06-10 DIAGNOSIS — S82409A Unspecified fracture of shaft of unspecified fibula, initial encounter for closed fracture: Secondary | ICD-10-CM | POA: Diagnosis not present

## 2013-06-11 DIAGNOSIS — M79609 Pain in unspecified limb: Secondary | ICD-10-CM | POA: Diagnosis not present

## 2013-06-11 DIAGNOSIS — L608 Other nail disorders: Secondary | ICD-10-CM | POA: Diagnosis not present

## 2013-06-13 DIAGNOSIS — N390 Urinary tract infection, site not specified: Secondary | ICD-10-CM | POA: Diagnosis not present

## 2013-06-13 DIAGNOSIS — N393 Stress incontinence (female) (male): Secondary | ICD-10-CM | POA: Diagnosis not present

## 2013-06-13 DIAGNOSIS — N3941 Urge incontinence: Secondary | ICD-10-CM | POA: Diagnosis not present

## 2013-06-13 DIAGNOSIS — N318 Other neuromuscular dysfunction of bladder: Secondary | ICD-10-CM | POA: Diagnosis not present

## 2013-06-17 DIAGNOSIS — F333 Major depressive disorder, recurrent, severe with psychotic symptoms: Secondary | ICD-10-CM | POA: Diagnosis not present

## 2013-06-20 DIAGNOSIS — J45909 Unspecified asthma, uncomplicated: Secondary | ICD-10-CM | POA: Diagnosis not present

## 2013-06-20 DIAGNOSIS — J309 Allergic rhinitis, unspecified: Secondary | ICD-10-CM | POA: Diagnosis not present

## 2013-06-24 DIAGNOSIS — N390 Urinary tract infection, site not specified: Secondary | ICD-10-CM | POA: Diagnosis not present

## 2013-06-24 DIAGNOSIS — N3941 Urge incontinence: Secondary | ICD-10-CM | POA: Diagnosis not present

## 2013-06-24 DIAGNOSIS — N318 Other neuromuscular dysfunction of bladder: Secondary | ICD-10-CM | POA: Diagnosis not present

## 2013-06-24 DIAGNOSIS — N393 Stress incontinence (female) (male): Secondary | ICD-10-CM | POA: Diagnosis not present

## 2013-06-26 DIAGNOSIS — I10 Essential (primary) hypertension: Secondary | ICD-10-CM | POA: Diagnosis not present

## 2013-06-26 DIAGNOSIS — N393 Stress incontinence (female) (male): Secondary | ICD-10-CM | POA: Diagnosis not present

## 2013-06-26 DIAGNOSIS — J45909 Unspecified asthma, uncomplicated: Secondary | ICD-10-CM | POA: Diagnosis not present

## 2013-06-26 DIAGNOSIS — Z79899 Other long term (current) drug therapy: Secondary | ICD-10-CM | POA: Diagnosis not present

## 2013-06-26 DIAGNOSIS — K219 Gastro-esophageal reflux disease without esophagitis: Secondary | ICD-10-CM | POA: Diagnosis not present

## 2013-07-04 DIAGNOSIS — N318 Other neuromuscular dysfunction of bladder: Secondary | ICD-10-CM | POA: Diagnosis not present

## 2013-07-04 DIAGNOSIS — N390 Urinary tract infection, site not specified: Secondary | ICD-10-CM | POA: Diagnosis not present

## 2013-07-04 DIAGNOSIS — N393 Stress incontinence (female) (male): Secondary | ICD-10-CM | POA: Diagnosis not present

## 2013-07-04 DIAGNOSIS — N3941 Urge incontinence: Secondary | ICD-10-CM | POA: Diagnosis not present

## 2013-07-17 DIAGNOSIS — J45901 Unspecified asthma with (acute) exacerbation: Secondary | ICD-10-CM | POA: Diagnosis not present

## 2013-07-17 DIAGNOSIS — J019 Acute sinusitis, unspecified: Secondary | ICD-10-CM | POA: Diagnosis not present

## 2013-07-24 DIAGNOSIS — Z1231 Encounter for screening mammogram for malignant neoplasm of breast: Secondary | ICD-10-CM | POA: Diagnosis not present

## 2013-07-24 DIAGNOSIS — M949 Disorder of cartilage, unspecified: Secondary | ICD-10-CM | POA: Diagnosis not present

## 2013-07-24 DIAGNOSIS — M899 Disorder of bone, unspecified: Secondary | ICD-10-CM | POA: Diagnosis not present

## 2013-08-07 DIAGNOSIS — M19049 Primary osteoarthritis, unspecified hand: Secondary | ICD-10-CM | POA: Diagnosis not present

## 2013-08-10 DIAGNOSIS — L570 Actinic keratosis: Secondary | ICD-10-CM | POA: Diagnosis not present

## 2013-08-12 DIAGNOSIS — L4050 Arthropathic psoriasis, unspecified: Secondary | ICD-10-CM | POA: Diagnosis not present

## 2013-09-10 DIAGNOSIS — L608 Other nail disorders: Secondary | ICD-10-CM | POA: Diagnosis not present

## 2013-09-20 DIAGNOSIS — M949 Disorder of cartilage, unspecified: Secondary | ICD-10-CM | POA: Diagnosis not present

## 2013-09-20 DIAGNOSIS — M899 Disorder of bone, unspecified: Secondary | ICD-10-CM | POA: Diagnosis not present

## 2013-09-23 DIAGNOSIS — L4050 Arthropathic psoriasis, unspecified: Secondary | ICD-10-CM | POA: Diagnosis not present

## 2013-09-25 DIAGNOSIS — E782 Mixed hyperlipidemia: Secondary | ICD-10-CM | POA: Diagnosis not present

## 2013-09-25 DIAGNOSIS — N183 Chronic kidney disease, stage 3 unspecified: Secondary | ICD-10-CM | POA: Diagnosis not present

## 2013-09-25 DIAGNOSIS — E119 Type 2 diabetes mellitus without complications: Secondary | ICD-10-CM | POA: Diagnosis not present

## 2013-10-01 DIAGNOSIS — I10 Essential (primary) hypertension: Secondary | ICD-10-CM | POA: Diagnosis not present

## 2013-10-01 DIAGNOSIS — E119 Type 2 diabetes mellitus without complications: Secondary | ICD-10-CM | POA: Diagnosis not present

## 2013-10-01 DIAGNOSIS — E782 Mixed hyperlipidemia: Secondary | ICD-10-CM | POA: Diagnosis not present

## 2013-10-30 DIAGNOSIS — M19049 Primary osteoarthritis, unspecified hand: Secondary | ICD-10-CM | POA: Diagnosis not present

## 2013-10-30 DIAGNOSIS — L4050 Arthropathic psoriasis, unspecified: Secondary | ICD-10-CM | POA: Diagnosis not present

## 2013-11-20 DIAGNOSIS — N390 Urinary tract infection, site not specified: Secondary | ICD-10-CM | POA: Diagnosis not present

## 2013-11-20 DIAGNOSIS — R197 Diarrhea, unspecified: Secondary | ICD-10-CM | POA: Diagnosis not present

## 2013-12-02 DIAGNOSIS — Z23 Encounter for immunization: Secondary | ICD-10-CM | POA: Diagnosis not present

## 2013-12-02 DIAGNOSIS — M25519 Pain in unspecified shoulder: Secondary | ICD-10-CM | POA: Diagnosis not present

## 2013-12-02 DIAGNOSIS — L03019 Cellulitis of unspecified finger: Secondary | ICD-10-CM | POA: Diagnosis not present

## 2013-12-03 DIAGNOSIS — N393 Stress incontinence (female) (male): Secondary | ICD-10-CM | POA: Diagnosis not present

## 2013-12-03 DIAGNOSIS — R39198 Other difficulties with micturition: Secondary | ICD-10-CM | POA: Diagnosis not present

## 2013-12-03 DIAGNOSIS — N318 Other neuromuscular dysfunction of bladder: Secondary | ICD-10-CM | POA: Diagnosis not present

## 2013-12-03 DIAGNOSIS — N390 Urinary tract infection, site not specified: Secondary | ICD-10-CM | POA: Diagnosis not present

## 2013-12-05 DIAGNOSIS — F3181 Bipolar II disorder: Secondary | ICD-10-CM | POA: Diagnosis not present

## 2013-12-25 DIAGNOSIS — L4050 Arthropathic psoriasis, unspecified: Secondary | ICD-10-CM | POA: Diagnosis not present

## 2013-12-25 DIAGNOSIS — M1990 Unspecified osteoarthritis, unspecified site: Secondary | ICD-10-CM | POA: Diagnosis not present

## 2014-01-16 DIAGNOSIS — L400 Psoriasis vulgaris: Secondary | ICD-10-CM | POA: Diagnosis not present

## 2014-01-23 DIAGNOSIS — J370 Chronic laryngitis: Secondary | ICD-10-CM | POA: Diagnosis not present

## 2014-01-23 DIAGNOSIS — J309 Allergic rhinitis, unspecified: Secondary | ICD-10-CM | POA: Diagnosis not present

## 2014-01-23 DIAGNOSIS — J4540 Moderate persistent asthma, uncomplicated: Secondary | ICD-10-CM | POA: Diagnosis not present

## 2014-02-11 DIAGNOSIS — E119 Type 2 diabetes mellitus without complications: Secondary | ICD-10-CM | POA: Diagnosis not present

## 2014-02-11 DIAGNOSIS — E782 Mixed hyperlipidemia: Secondary | ICD-10-CM | POA: Diagnosis not present

## 2014-02-11 DIAGNOSIS — J45909 Unspecified asthma, uncomplicated: Secondary | ICD-10-CM | POA: Diagnosis not present

## 2014-02-11 DIAGNOSIS — I10 Essential (primary) hypertension: Secondary | ICD-10-CM | POA: Diagnosis not present

## 2014-02-20 DIAGNOSIS — N3941 Urge incontinence: Secondary | ICD-10-CM | POA: Diagnosis not present

## 2014-02-20 DIAGNOSIS — J449 Chronic obstructive pulmonary disease, unspecified: Secondary | ICD-10-CM | POA: Diagnosis not present

## 2014-02-20 DIAGNOSIS — N3281 Overactive bladder: Secondary | ICD-10-CM | POA: Diagnosis not present

## 2014-02-20 DIAGNOSIS — N390 Urinary tract infection, site not specified: Secondary | ICD-10-CM | POA: Diagnosis not present

## 2014-02-20 DIAGNOSIS — N3090 Cystitis, unspecified without hematuria: Secondary | ICD-10-CM | POA: Diagnosis not present

## 2014-03-13 DIAGNOSIS — R5383 Other fatigue: Secondary | ICD-10-CM | POA: Diagnosis not present

## 2014-03-13 DIAGNOSIS — J449 Chronic obstructive pulmonary disease, unspecified: Secondary | ICD-10-CM | POA: Diagnosis not present

## 2014-03-13 DIAGNOSIS — N3941 Urge incontinence: Secondary | ICD-10-CM | POA: Diagnosis not present

## 2014-03-13 DIAGNOSIS — I10 Essential (primary) hypertension: Secondary | ICD-10-CM | POA: Diagnosis not present

## 2014-03-13 DIAGNOSIS — N3090 Cystitis, unspecified without hematuria: Secondary | ICD-10-CM | POA: Diagnosis not present

## 2014-03-13 DIAGNOSIS — N318 Other neuromuscular dysfunction of bladder: Secondary | ICD-10-CM | POA: Diagnosis not present

## 2014-03-13 DIAGNOSIS — N3281 Overactive bladder: Secondary | ICD-10-CM | POA: Diagnosis not present

## 2014-03-19 DIAGNOSIS — L4050 Arthropathic psoriasis, unspecified: Secondary | ICD-10-CM | POA: Diagnosis not present

## 2014-03-19 DIAGNOSIS — M1990 Unspecified osteoarthritis, unspecified site: Secondary | ICD-10-CM | POA: Diagnosis not present

## 2014-04-04 DIAGNOSIS — M8580 Other specified disorders of bone density and structure, unspecified site: Secondary | ICD-10-CM | POA: Diagnosis not present

## 2014-06-03 DIAGNOSIS — M2011 Hallux valgus (acquired), right foot: Secondary | ICD-10-CM | POA: Diagnosis not present

## 2014-06-16 DIAGNOSIS — E119 Type 2 diabetes mellitus without complications: Secondary | ICD-10-CM | POA: Diagnosis not present

## 2014-06-16 DIAGNOSIS — N183 Chronic kidney disease, stage 3 (moderate): Secondary | ICD-10-CM | POA: Diagnosis not present

## 2014-06-16 DIAGNOSIS — I10 Essential (primary) hypertension: Secondary | ICD-10-CM | POA: Diagnosis not present

## 2014-06-16 DIAGNOSIS — E782 Mixed hyperlipidemia: Secondary | ICD-10-CM | POA: Diagnosis not present

## 2014-07-17 DIAGNOSIS — L719 Rosacea, unspecified: Secondary | ICD-10-CM | POA: Diagnosis not present

## 2014-07-17 DIAGNOSIS — L821 Other seborrheic keratosis: Secondary | ICD-10-CM | POA: Diagnosis not present

## 2014-07-17 DIAGNOSIS — L570 Actinic keratosis: Secondary | ICD-10-CM | POA: Diagnosis not present

## 2014-07-29 DIAGNOSIS — F3181 Bipolar II disorder: Secondary | ICD-10-CM | POA: Diagnosis not present

## 2014-07-29 DIAGNOSIS — F332 Major depressive disorder, recurrent severe without psychotic features: Secondary | ICD-10-CM | POA: Diagnosis not present

## 2014-08-08 DIAGNOSIS — J45901 Unspecified asthma with (acute) exacerbation: Secondary | ICD-10-CM | POA: Diagnosis not present

## 2014-08-08 DIAGNOSIS — Z6831 Body mass index (BMI) 31.0-31.9, adult: Secondary | ICD-10-CM | POA: Diagnosis not present

## 2014-08-08 DIAGNOSIS — R509 Fever, unspecified: Secondary | ICD-10-CM | POA: Diagnosis not present

## 2014-10-01 DIAGNOSIS — M8580 Other specified disorders of bone density and structure, unspecified site: Secondary | ICD-10-CM | POA: Diagnosis not present

## 2014-10-02 DIAGNOSIS — I10 Essential (primary) hypertension: Secondary | ICD-10-CM | POA: Diagnosis not present

## 2014-10-02 DIAGNOSIS — N3090 Cystitis, unspecified without hematuria: Secondary | ICD-10-CM | POA: Diagnosis not present

## 2014-10-02 DIAGNOSIS — N318 Other neuromuscular dysfunction of bladder: Secondary | ICD-10-CM | POA: Diagnosis not present

## 2014-10-02 DIAGNOSIS — J449 Chronic obstructive pulmonary disease, unspecified: Secondary | ICD-10-CM | POA: Diagnosis not present

## 2014-10-02 DIAGNOSIS — N3941 Urge incontinence: Secondary | ICD-10-CM | POA: Diagnosis not present

## 2014-10-14 DIAGNOSIS — J449 Chronic obstructive pulmonary disease, unspecified: Secondary | ICD-10-CM | POA: Diagnosis not present

## 2014-10-14 DIAGNOSIS — N3941 Urge incontinence: Secondary | ICD-10-CM | POA: Diagnosis not present

## 2014-10-14 DIAGNOSIS — I10 Essential (primary) hypertension: Secondary | ICD-10-CM | POA: Diagnosis not present

## 2014-10-14 DIAGNOSIS — N318 Other neuromuscular dysfunction of bladder: Secondary | ICD-10-CM | POA: Diagnosis not present

## 2014-10-14 DIAGNOSIS — N3090 Cystitis, unspecified without hematuria: Secondary | ICD-10-CM | POA: Diagnosis not present

## 2014-10-15 DIAGNOSIS — L4050 Arthropathic psoriasis, unspecified: Secondary | ICD-10-CM | POA: Diagnosis not present

## 2014-10-15 DIAGNOSIS — M1990 Unspecified osteoarthritis, unspecified site: Secondary | ICD-10-CM | POA: Diagnosis not present

## 2014-11-03 DIAGNOSIS — J4540 Moderate persistent asthma, uncomplicated: Secondary | ICD-10-CM | POA: Diagnosis not present

## 2014-11-03 DIAGNOSIS — J309 Allergic rhinitis, unspecified: Secondary | ICD-10-CM | POA: Diagnosis not present

## 2014-11-06 DIAGNOSIS — I340 Nonrheumatic mitral (valve) insufficiency: Secondary | ICD-10-CM | POA: Diagnosis not present

## 2014-11-06 DIAGNOSIS — R05 Cough: Secondary | ICD-10-CM | POA: Diagnosis not present

## 2014-11-06 DIAGNOSIS — I361 Nonrheumatic tricuspid (valve) insufficiency: Secondary | ICD-10-CM | POA: Diagnosis not present

## 2014-11-07 DIAGNOSIS — Z6829 Body mass index (BMI) 29.0-29.9, adult: Secondary | ICD-10-CM | POA: Diagnosis not present

## 2014-11-07 DIAGNOSIS — H6591 Unspecified nonsuppurative otitis media, right ear: Secondary | ICD-10-CM | POA: Diagnosis not present

## 2014-11-12 DIAGNOSIS — H1010 Acute atopic conjunctivitis, unspecified eye: Secondary | ICD-10-CM | POA: Insufficient documentation

## 2014-11-12 DIAGNOSIS — J45909 Unspecified asthma, uncomplicated: Secondary | ICD-10-CM | POA: Insufficient documentation

## 2014-11-12 DIAGNOSIS — J309 Allergic rhinitis, unspecified: Secondary | ICD-10-CM | POA: Insufficient documentation

## 2014-11-12 DIAGNOSIS — K219 Gastro-esophageal reflux disease without esophagitis: Secondary | ICD-10-CM | POA: Insufficient documentation

## 2014-11-19 DIAGNOSIS — J4540 Moderate persistent asthma, uncomplicated: Secondary | ICD-10-CM | POA: Diagnosis not present

## 2014-11-19 DIAGNOSIS — E069 Thyroiditis, unspecified: Secondary | ICD-10-CM | POA: Diagnosis not present

## 2014-11-21 DIAGNOSIS — I5189 Other ill-defined heart diseases: Secondary | ICD-10-CM | POA: Insufficient documentation

## 2014-11-21 DIAGNOSIS — I519 Heart disease, unspecified: Secondary | ICD-10-CM | POA: Diagnosis not present

## 2014-11-21 DIAGNOSIS — E785 Hyperlipidemia, unspecified: Secondary | ICD-10-CM | POA: Insufficient documentation

## 2014-11-21 DIAGNOSIS — R931 Abnormal findings on diagnostic imaging of heart and coronary circulation: Secondary | ICD-10-CM | POA: Diagnosis not present

## 2014-11-21 DIAGNOSIS — I10 Essential (primary) hypertension: Secondary | ICD-10-CM | POA: Diagnosis not present

## 2014-11-21 DIAGNOSIS — R9431 Abnormal electrocardiogram [ECG] [EKG]: Secondary | ICD-10-CM | POA: Diagnosis not present

## 2014-11-21 DIAGNOSIS — L4050 Arthropathic psoriasis, unspecified: Secondary | ICD-10-CM | POA: Insufficient documentation

## 2014-11-26 DIAGNOSIS — F3181 Bipolar II disorder: Secondary | ICD-10-CM | POA: Diagnosis not present

## 2014-11-27 DIAGNOSIS — H9191 Unspecified hearing loss, right ear: Secondary | ICD-10-CM | POA: Diagnosis not present

## 2014-11-27 DIAGNOSIS — Z8709 Personal history of other diseases of the respiratory system: Secondary | ICD-10-CM | POA: Diagnosis not present

## 2014-11-27 DIAGNOSIS — J342 Deviated nasal septum: Secondary | ICD-10-CM | POA: Diagnosis not present

## 2015-01-07 DIAGNOSIS — Z23 Encounter for immunization: Secondary | ICD-10-CM | POA: Diagnosis not present

## 2015-01-26 ENCOUNTER — Other Ambulatory Visit: Payer: Self-pay

## 2015-01-26 MED ORDER — FLUTICASONE PROPIONATE 50 MCG/ACT NA SUSP: 1.0000 | NASAL | Status: AC | PRN

## 2015-01-26 MED ORDER — OMEPRAZOLE 40 MG PO CPDR: 40.0000 mg | DELAYED_RELEASE_CAPSULE | Freq: Every day | ORAL | Status: DC

## 2015-02-02 DIAGNOSIS — L400 Psoriasis vulgaris: Secondary | ICD-10-CM | POA: Diagnosis not present

## 2015-03-13 DIAGNOSIS — L4050 Arthropathic psoriasis, unspecified: Secondary | ICD-10-CM | POA: Diagnosis not present

## 2015-03-13 DIAGNOSIS — Z9181 History of falling: Secondary | ICD-10-CM | POA: Diagnosis not present

## 2015-03-13 DIAGNOSIS — Z6830 Body mass index (BMI) 30.0-30.9, adult: Secondary | ICD-10-CM | POA: Diagnosis not present

## 2015-03-13 DIAGNOSIS — E669 Obesity, unspecified: Secondary | ICD-10-CM | POA: Diagnosis not present

## 2015-03-25 DIAGNOSIS — Z6830 Body mass index (BMI) 30.0-30.9, adult: Secondary | ICD-10-CM | POA: Diagnosis not present

## 2015-03-25 DIAGNOSIS — J45909 Unspecified asthma, uncomplicated: Secondary | ICD-10-CM | POA: Diagnosis not present

## 2015-03-30 ENCOUNTER — Other Ambulatory Visit: Payer: Self-pay

## 2015-03-30 MED ORDER — FEXOFENADINE HCL 180 MG PO TABS: 180.0000 mg | ORAL_TABLET | Freq: Every day | ORAL | Status: DC

## 2015-04-15 DIAGNOSIS — M1990 Unspecified osteoarthritis, unspecified site: Secondary | ICD-10-CM | POA: Diagnosis not present

## 2015-04-15 DIAGNOSIS — L4050 Arthropathic psoriasis, unspecified: Secondary | ICD-10-CM | POA: Diagnosis not present

## 2015-04-21 DIAGNOSIS — F3181 Bipolar II disorder: Secondary | ICD-10-CM | POA: Diagnosis not present

## 2015-04-23 DIAGNOSIS — M8580 Other specified disorders of bone density and structure, unspecified site: Secondary | ICD-10-CM | POA: Diagnosis not present

## 2015-04-23 DIAGNOSIS — M2042 Other hammer toe(s) (acquired), left foot: Secondary | ICD-10-CM | POA: Diagnosis not present

## 2015-04-23 DIAGNOSIS — L84 Corns and callosities: Secondary | ICD-10-CM | POA: Diagnosis not present

## 2015-04-23 DIAGNOSIS — M2012 Hallux valgus (acquired), left foot: Secondary | ICD-10-CM | POA: Diagnosis not present

## 2015-05-11 DIAGNOSIS — N183 Chronic kidney disease, stage 3 (moderate): Secondary | ICD-10-CM | POA: Diagnosis not present

## 2015-05-11 DIAGNOSIS — E119 Type 2 diabetes mellitus without complications: Secondary | ICD-10-CM | POA: Diagnosis not present

## 2015-05-11 DIAGNOSIS — E782 Mixed hyperlipidemia: Secondary | ICD-10-CM | POA: Diagnosis not present

## 2015-05-13 DIAGNOSIS — E119 Type 2 diabetes mellitus without complications: Secondary | ICD-10-CM | POA: Diagnosis not present

## 2015-05-13 DIAGNOSIS — Z6830 Body mass index (BMI) 30.0-30.9, adult: Secondary | ICD-10-CM | POA: Diagnosis not present

## 2015-05-13 DIAGNOSIS — E782 Mixed hyperlipidemia: Secondary | ICD-10-CM | POA: Diagnosis not present

## 2015-05-13 DIAGNOSIS — Z1389 Encounter for screening for other disorder: Secondary | ICD-10-CM | POA: Diagnosis not present

## 2015-05-13 DIAGNOSIS — E669 Obesity, unspecified: Secondary | ICD-10-CM | POA: Diagnosis not present

## 2015-05-13 DIAGNOSIS — I10 Essential (primary) hypertension: Secondary | ICD-10-CM | POA: Diagnosis not present

## 2015-05-13 DIAGNOSIS — N182 Chronic kidney disease, stage 2 (mild): Secondary | ICD-10-CM | POA: Diagnosis not present

## 2015-05-13 DIAGNOSIS — M1990 Unspecified osteoarthritis, unspecified site: Secondary | ICD-10-CM | POA: Diagnosis not present

## 2015-05-13 DIAGNOSIS — E2839 Other primary ovarian failure: Secondary | ICD-10-CM | POA: Diagnosis not present

## 2015-05-13 DIAGNOSIS — L4050 Arthropathic psoriasis, unspecified: Secondary | ICD-10-CM | POA: Diagnosis not present

## 2015-05-13 DIAGNOSIS — Z1231 Encounter for screening mammogram for malignant neoplasm of breast: Secondary | ICD-10-CM | POA: Diagnosis not present

## 2015-06-22 DIAGNOSIS — E785 Hyperlipidemia, unspecified: Secondary | ICD-10-CM | POA: Diagnosis not present

## 2015-06-22 DIAGNOSIS — I10 Essential (primary) hypertension: Secondary | ICD-10-CM | POA: Diagnosis not present

## 2015-06-22 DIAGNOSIS — I519 Heart disease, unspecified: Secondary | ICD-10-CM | POA: Diagnosis not present

## 2015-07-16 DIAGNOSIS — M25461 Effusion, right knee: Secondary | ICD-10-CM | POA: Diagnosis not present

## 2015-07-16 DIAGNOSIS — M25561 Pain in right knee: Secondary | ICD-10-CM | POA: Diagnosis not present

## 2015-07-16 DIAGNOSIS — Z96651 Presence of right artificial knee joint: Secondary | ICD-10-CM | POA: Diagnosis not present

## 2015-07-29 DIAGNOSIS — E2839 Other primary ovarian failure: Secondary | ICD-10-CM | POA: Diagnosis not present

## 2015-07-29 DIAGNOSIS — Z1231 Encounter for screening mammogram for malignant neoplasm of breast: Secondary | ICD-10-CM | POA: Diagnosis not present

## 2015-07-29 DIAGNOSIS — M8589 Other specified disorders of bone density and structure, multiple sites: Secondary | ICD-10-CM | POA: Diagnosis not present

## 2015-07-29 DIAGNOSIS — R29890 Loss of height: Secondary | ICD-10-CM | POA: Diagnosis not present

## 2015-07-29 DIAGNOSIS — M8580 Other specified disorders of bone density and structure, unspecified site: Secondary | ICD-10-CM | POA: Diagnosis not present

## 2015-08-10 DIAGNOSIS — L570 Actinic keratosis: Secondary | ICD-10-CM | POA: Diagnosis not present

## 2015-08-10 DIAGNOSIS — L400 Psoriasis vulgaris: Secondary | ICD-10-CM | POA: Diagnosis not present

## 2015-08-10 DIAGNOSIS — L299 Pruritus, unspecified: Secondary | ICD-10-CM | POA: Diagnosis not present

## 2015-08-12 DIAGNOSIS — L4050 Arthropathic psoriasis, unspecified: Secondary | ICD-10-CM | POA: Diagnosis not present

## 2015-08-12 DIAGNOSIS — M1990 Unspecified osteoarthritis, unspecified site: Secondary | ICD-10-CM | POA: Diagnosis not present

## 2015-08-12 DIAGNOSIS — Z79899 Other long term (current) drug therapy: Secondary | ICD-10-CM | POA: Diagnosis not present

## 2015-09-03 ENCOUNTER — Other Ambulatory Visit: Payer: Self-pay | Admitting: Allergy and Immunology

## 2015-10-01 DIAGNOSIS — L309 Dermatitis, unspecified: Secondary | ICD-10-CM | POA: Diagnosis not present

## 2015-10-21 DIAGNOSIS — H2513 Age-related nuclear cataract, bilateral: Secondary | ICD-10-CM | POA: Diagnosis not present

## 2015-10-23 DIAGNOSIS — M8580 Other specified disorders of bone density and structure, unspecified site: Secondary | ICD-10-CM | POA: Diagnosis not present

## 2015-11-11 DIAGNOSIS — M1990 Unspecified osteoarthritis, unspecified site: Secondary | ICD-10-CM | POA: Diagnosis not present

## 2015-11-11 DIAGNOSIS — L4050 Arthropathic psoriasis, unspecified: Secondary | ICD-10-CM | POA: Diagnosis not present

## 2015-11-11 DIAGNOSIS — Z79899 Other long term (current) drug therapy: Secondary | ICD-10-CM | POA: Diagnosis not present

## 2015-11-24 DIAGNOSIS — Z23 Encounter for immunization: Secondary | ICD-10-CM | POA: Diagnosis not present

## 2015-11-24 DIAGNOSIS — Z1389 Encounter for screening for other disorder: Secondary | ICD-10-CM | POA: Diagnosis not present

## 2015-11-24 DIAGNOSIS — Z6830 Body mass index (BMI) 30.0-30.9, adult: Secondary | ICD-10-CM | POA: Diagnosis not present

## 2015-11-24 DIAGNOSIS — L4050 Arthropathic psoriasis, unspecified: Secondary | ICD-10-CM | POA: Diagnosis not present

## 2015-11-24 DIAGNOSIS — E782 Mixed hyperlipidemia: Secondary | ICD-10-CM | POA: Diagnosis not present

## 2015-11-24 DIAGNOSIS — E119 Type 2 diabetes mellitus without complications: Secondary | ICD-10-CM | POA: Diagnosis not present

## 2015-11-24 DIAGNOSIS — I10 Essential (primary) hypertension: Secondary | ICD-10-CM | POA: Diagnosis not present

## 2015-11-24 DIAGNOSIS — N182 Chronic kidney disease, stage 2 (mild): Secondary | ICD-10-CM | POA: Diagnosis not present

## 2016-01-07 ENCOUNTER — Ambulatory Visit: Payer: Self-pay | Admitting: Rheumatology

## 2016-01-12 DIAGNOSIS — L400 Psoriasis vulgaris: Secondary | ICD-10-CM | POA: Diagnosis not present

## 2016-01-12 DIAGNOSIS — L299 Pruritus, unspecified: Secondary | ICD-10-CM | POA: Diagnosis not present

## 2016-01-19 DIAGNOSIS — F3181 Bipolar II disorder: Secondary | ICD-10-CM | POA: Diagnosis not present

## 2016-02-04 DIAGNOSIS — L400 Psoriasis vulgaris: Secondary | ICD-10-CM | POA: Diagnosis not present

## 2016-02-04 DIAGNOSIS — L299 Pruritus, unspecified: Secondary | ICD-10-CM | POA: Diagnosis not present

## 2016-02-10 ENCOUNTER — Ambulatory Visit: Payer: Self-pay | Admitting: Rheumatology

## 2016-03-28 DIAGNOSIS — M2550 Pain in unspecified joint: Secondary | ICD-10-CM | POA: Diagnosis not present

## 2016-03-28 DIAGNOSIS — M79641 Pain in right hand: Secondary | ICD-10-CM | POA: Diagnosis not present

## 2016-03-28 DIAGNOSIS — M79642 Pain in left hand: Secondary | ICD-10-CM | POA: Diagnosis not present

## 2016-03-28 DIAGNOSIS — Z7982 Long term (current) use of aspirin: Secondary | ICD-10-CM | POA: Diagnosis not present

## 2016-03-28 DIAGNOSIS — L4050 Arthropathic psoriasis, unspecified: Secondary | ICD-10-CM | POA: Diagnosis not present

## 2016-03-28 DIAGNOSIS — Z79899 Other long term (current) drug therapy: Secondary | ICD-10-CM | POA: Diagnosis not present

## 2016-03-28 DIAGNOSIS — M25532 Pain in left wrist: Secondary | ICD-10-CM | POA: Diagnosis not present

## 2016-03-28 DIAGNOSIS — G8929 Other chronic pain: Secondary | ICD-10-CM | POA: Diagnosis not present

## 2016-03-28 DIAGNOSIS — M25531 Pain in right wrist: Secondary | ICD-10-CM | POA: Diagnosis not present

## 2016-03-28 DIAGNOSIS — M545 Low back pain: Secondary | ICD-10-CM | POA: Diagnosis not present

## 2016-03-28 DIAGNOSIS — M1990 Unspecified osteoarthritis, unspecified site: Secondary | ICD-10-CM | POA: Diagnosis not present

## 2016-03-28 DIAGNOSIS — M25561 Pain in right knee: Secondary | ICD-10-CM | POA: Diagnosis not present

## 2016-04-10 DIAGNOSIS — I10 Essential (primary) hypertension: Secondary | ICD-10-CM | POA: Diagnosis not present

## 2016-04-10 DIAGNOSIS — Z96651 Presence of right artificial knee joint: Secondary | ICD-10-CM | POA: Diagnosis not present

## 2016-04-10 DIAGNOSIS — S82141A Displaced bicondylar fracture of right tibia, initial encounter for closed fracture: Secondary | ICD-10-CM | POA: Diagnosis not present

## 2016-04-10 DIAGNOSIS — M79661 Pain in right lower leg: Secondary | ICD-10-CM | POA: Diagnosis not present

## 2016-04-10 DIAGNOSIS — S8991XA Unspecified injury of right lower leg, initial encounter: Secondary | ICD-10-CM | POA: Diagnosis not present

## 2016-04-11 DIAGNOSIS — T8484XA Pain due to internal orthopedic prosthetic devices, implants and grafts, initial encounter: Secondary | ICD-10-CM | POA: Diagnosis not present

## 2016-04-11 DIAGNOSIS — Z96651 Presence of right artificial knee joint: Secondary | ICD-10-CM | POA: Diagnosis not present

## 2016-04-11 DIAGNOSIS — Z471 Aftercare following joint replacement surgery: Secondary | ICD-10-CM | POA: Diagnosis not present

## 2016-04-11 DIAGNOSIS — S8981XD Other specified injuries of right lower leg, subsequent encounter: Secondary | ICD-10-CM | POA: Diagnosis not present

## 2016-04-12 ENCOUNTER — Other Ambulatory Visit (HOSPITAL_COMMUNITY): Payer: Self-pay | Admitting: Orthopedic Surgery

## 2016-04-12 DIAGNOSIS — M2341 Loose body in knee, right knee: Secondary | ICD-10-CM

## 2016-04-19 ENCOUNTER — Encounter (HOSPITAL_COMMUNITY)
Admission: RE | Admit: 2016-04-19 | Discharge: 2016-04-19 | Disposition: A | Discharge status: Home / Self Care | Payer: Medicare Other | Source: Ambulatory Visit | Attending: Orthopedic Surgery | Admitting: Orthopedic Surgery

## 2016-04-19 DIAGNOSIS — M2341 Loose body in knee, right knee: Secondary | ICD-10-CM | POA: Diagnosis not present

## 2016-04-19 DIAGNOSIS — M7989 Other specified soft tissue disorders: Secondary | ICD-10-CM | POA: Diagnosis not present

## 2016-04-19 MED ORDER — TECHNETIUM TC 99M MEDRONATE IV KIT: 25.0000 | PACK | Freq: Once | INTRAVENOUS | Status: DC | PRN

## 2016-04-26 DIAGNOSIS — T8489XD Other specified complication of internal orthopedic prosthetic devices, implants and grafts, subsequent encounter: Secondary | ICD-10-CM | POA: Diagnosis not present

## 2016-04-26 DIAGNOSIS — Z96651 Presence of right artificial knee joint: Secondary | ICD-10-CM | POA: Diagnosis not present

## 2016-04-27 DIAGNOSIS — E119 Type 2 diabetes mellitus without complications: Secondary | ICD-10-CM | POA: Diagnosis not present

## 2016-04-27 DIAGNOSIS — E663 Overweight: Secondary | ICD-10-CM | POA: Diagnosis not present

## 2016-04-27 DIAGNOSIS — Z6829 Body mass index (BMI) 29.0-29.9, adult: Secondary | ICD-10-CM | POA: Diagnosis not present

## 2016-04-27 DIAGNOSIS — Z9181 History of falling: Secondary | ICD-10-CM | POA: Diagnosis not present

## 2016-05-05 ENCOUNTER — Other Ambulatory Visit: Payer: Self-pay | Admitting: Orthopedic Surgery

## 2016-05-19 ENCOUNTER — Other Ambulatory Visit (HOSPITAL_COMMUNITY): Payer: Self-pay | Admitting: *Deleted

## 2016-05-19 NOTE — Pre-Procedure Instructions (Addendum)
Staria Birkhead  05/19/2016    Your procedure is scheduled on Monday, May 30, 2016 at 915   Report to [facility] Entrance "A" Admitting Office at 715 AM.   Call this number if you have problems the morning of surgery: [phone]   Questions prior to day of surgery, please call [phone] between 8 & 4 PM.   Remember:  Do not eat food or drink liquids after midnight Sunday, 05/29/16.  Take these medicines the morning of surgery with A SIP OF WATER: Fexofenadine (Allegra), Fluoxetine (Prozac), Ranitidine (Zantac), Tramadol - if needed, Buspirone (Buspar) - if needed, Flonase nasal spray, use inhalers and nebulizer as needed. Bring Albuterol inhaler with you day of surgery.  Stop Fish Oil, Multivitamins, Aspirin and NSAIDS (Ibuprofen, Celebrex, Aleve, etc) 5 days prior to surgery.   Do not wear jewelry, make-up or nail polish.  Do not wear lotions, powders or perfumes.  Do not shave 48 hours prior to surgery.    Do not bring valuables to the hospital.  [facility] is not responsible for any belongings or valuables.  Contacts, dentures or bridgework may not be worn into surgery.  Leave your suitcase in the car.  After surgery it may be brought to your room.  For patients admitted to the hospital, discharge time will be determined by your treatment team.  Special instructions:  [place] - Preparing for Surgery  Before surgery, you can play an important role.  Because skin is not sterile, your skin needs to be as free of germs as possible.  You can reduce the number of germs on you skin by washing with CHG (chlorahexidine gluconate) soap before surgery.  CHG is an antiseptic cleaner which kills germs and bonds with the skin to continue killing germs even after washing.  Please DO NOT use if you have an allergy to CHG or antibacterial soaps.  If your skin becomes reddened/irritated stop using the CHG and inform your nurse when you arrive at Short Stay.  Do not shave  (including legs and underarms) for at least 48 hours prior to the first CHG shower.  You may shave your face.  Please follow these instructions carefully:   1.  Shower with CHG Soap the night before surgery and the                    morning of Surgery.  2.  If you choose to wash your hair, wash your hair first as usual with your       normal shampoo.  3.  After you shampoo, rinse your hair and body thoroughly to remove the shampoo.  4.  Use CHG as you would any other liquid soap.  You can apply chg directly       to the skin and wash gently with scrungie or a clean washcloth.  5.  Apply the CHG Soap to your body ONLY FROM THE NECK DOWN.        Do not use on open wounds or open sores.  Avoid contact with your eyes, ears, mouth and genitals (private parts).  Wash genitals (private parts) with your normal soap.  6.  Wash thoroughly, paying special attention to the area where your surgery        will be performed.  7.  Thoroughly rinse your body with warm water from the neck down.  8.  DO NOT shower/wash with your normal soap after using and rinsing off  the CHG Soap.  9.  Pat yourself dry with a clean towel.            10.  Wear clean pajamas.            11.  Place clean sheets on your bed the night of your first shower and do not        sleep with pets.  Day of Surgery  Do not apply any lotions/deodorants the morning of surgery.  Please wear clean clothes to the hospital.   Please read over the fact sheets that you were given.

## 2016-05-20 ENCOUNTER — Encounter (HOSPITAL_COMMUNITY)
Admission: RE | Admit: 2016-05-20 | Discharge: 2016-05-20 | Disposition: A | Discharge status: Home / Self Care | Payer: Medicare Other | Source: Ambulatory Visit | Attending: Orthopedic Surgery | Admitting: Orthopedic Surgery

## 2016-05-20 ENCOUNTER — Encounter (HOSPITAL_COMMUNITY): Payer: Self-pay

## 2016-05-20 DIAGNOSIS — I10 Essential (primary) hypertension: Secondary | ICD-10-CM | POA: Diagnosis not present

## 2016-05-20 DIAGNOSIS — T84032A Mechanical loosening of internal right knee prosthetic joint, initial encounter: Secondary | ICD-10-CM | POA: Insufficient documentation

## 2016-05-20 DIAGNOSIS — Y838 Other surgical procedures as the cause of abnormal reaction of the patient, or of later complication, without mention of misadventure at the time of the procedure: Secondary | ICD-10-CM | POA: Insufficient documentation

## 2016-05-20 DIAGNOSIS — Z01810 Encounter for preprocedural cardiovascular examination: Secondary | ICD-10-CM | POA: Diagnosis not present

## 2016-05-20 HISTORY — DX: Essential (primary) hypertension: I10

## 2016-05-20 HISTORY — DX: Anemia, unspecified: D64.9

## 2016-05-20 HISTORY — DX: Major depressive disorder, single episode, unspecified: F32.9

## 2016-05-20 HISTORY — DX: Anxiety disorder, unspecified: F41.9

## 2016-05-20 HISTORY — DX: Personal history of urinary calculi: Z87.442

## 2016-05-20 HISTORY — DX: Unspecified osteoarthritis, unspecified site: M19.90

## 2016-05-20 HISTORY — DX: Depression, unspecified: F32.A

## 2016-05-20 HISTORY — DX: Unspecified asthma, uncomplicated: J45.909

## 2016-05-20 HISTORY — DX: Gastro-esophageal reflux disease without esophagitis: K21.9

## 2016-05-20 HISTORY — DX: Cardiac murmur, unspecified: R01.1

## 2016-05-20 HISTORY — DX: Nausea with vomiting, unspecified: R11.2

## 2016-05-20 HISTORY — DX: Other specified postprocedural states: Z98.890

## 2016-05-20 LAB — COMPREHENSIVE METABOLIC PANEL
ALBUMIN: 3.8 g/dL (ref 3.5–5.0)
ALT: 9 U/L — ABNORMAL LOW (ref 14–54)
ANION GAP: 9 (ref 5–15)
AST: 17 U/L (ref 15–41)
Alkaline Phosphatase: 69 U/L (ref 38–126)
BUN: 13 mg/dL (ref 6–20)
CALCIUM: 10 mg/dL (ref 8.9–10.3)
CHLORIDE: 101 mmol/L (ref 101–111)
CO2: 26 mmol/L (ref 22–32)
CREATININE: 1.07 mg/dL — AB (ref 0.44–1.00)
GFR calc Af Amer: >60 mL/min (ref >60)
GFR calc non Af Amer: 53 mL/min — ABNORMAL LOW (ref >60)
Glucose, Bld: 91 mg/dL (ref 65–99)
Potassium: 3.7 mmol/L (ref 3.5–5.1)
SODIUM: 136 mmol/L (ref 135–145)
Total Bilirubin: 0.6 mg/dL (ref 0.3–1.2)
Total Protein: 7 g/dL (ref 6.5–8.1)

## 2016-05-20 LAB — TYPE AND SCREEN
ABO/RH(D): O POS
Antibody Screen: NEGATIVE

## 2016-05-20 LAB — CBC WITH DIFFERENTIAL/PLATELET
Basophils Absolute: 0 10*3/uL (ref 0.0–0.1)
Basophils Relative: 0 %
Eosinophils Absolute: 0.2 10*3/uL (ref 0.0–0.7)
Eosinophils Relative: 2 %
HEMATOCRIT: 39.5 % (ref 36.0–46.0)
HEMOGLOBIN: 12.8 g/dL (ref 12.0–15.0)
LYMPHS ABS: 1.4 10*3/uL (ref 0.7–4.0)
LYMPHS PCT: 13 %
MCH: 26.2 pg (ref 26.0–34.0)
MCHC: 32.4 g/dL (ref 30.0–36.0)
MCV: 80.9 fL (ref 78.0–100.0)
Monocytes Absolute: 1.2 10*3/uL — ABNORMAL HIGH (ref 0.1–1.0)
Monocytes Relative: 11 %
NEUTROS PCT: 74 %
Neutro Abs: 8 10*3/uL — ABNORMAL HIGH (ref 1.7–7.7)
PLATELETS: 255 10*3/uL (ref 150–400)
RBC: 4.88 MIL/uL (ref 3.87–5.11)
RDW: 14.3 % (ref 11.5–15.5)
WBC: 10.7 10*3/uL — AB (ref 4.0–10.5)

## 2016-05-20 LAB — SURGICAL PCR SCREEN
MRSA, PCR: NEGATIVE
STAPHYLOCOCCUS AUREUS: NEGATIVE

## 2016-05-20 NOTE — Progress Notes (Signed)
PCP is Cyndi Bender, PA-c Cardiologist id Dr Agustin Cree [facility]) Denies any chest pain, fever, or cough. Request sent to [facility] for EKG, Echo, and any heart studies. Pt reports she has a Mitral Valve Prolapse with a small murmur. (In Dr Agustin Cree he doesn't note a murmur)

## 2016-05-20 NOTE — Progress Notes (Signed)
Patient just arrived so we will see her for her PAT appointment.

## 2016-05-20 NOTE — Progress Notes (Signed)
Not here for PAT appointment called home number - that number is no longer in service, Cell phone number called and message left for her to reschedule PAT appointment.

## 2016-05-25 NOTE — Progress Notes (Signed)
Anesthesia Chart Review:  Pt is a 66 year old female scheduled for R total knee revision 05/30/2016 with Vickey Huger, M.D.  - PCP is Cyndi Bender, PA - Cardiologist is Jenne Campus, MD, last office visit 06/22/15 (notes in care everywhere).   PMH includes: diastolic CHF, HTN, MVP, anemia, asthma, psoriatic arthritis, post-op N/V, GERD. Never smoker. BMI 29.  Medications include: Albuterol, amlodipine-benazepril, ASA 81 mg, Qvar, Symbicort, Butrans patch, denosumab, Otezla, pravastatin, zantac.  Preoperative labs reviewed.    EKG 05/20/16: NSR. Possible Left atrial enlargement  Echo 11/06/14 [facility]):  1. LV systolic function is hyperdynamic with an estimated EF >70%.  2. Diastolic filling pattern indicates impaired relaxation.  3. LA moderately dilated by volume 4. Trace to mild MR 5. Trace TR 6. RV systolic pressure is normal at <35 mmHg.   If no changes, I anticipate pt can proceed with surgery as scheduled.   Willeen Cass, FNP-BC [facility] Short Stay Surgical Center/Anesthesiology Phone: [phone] 05/25/2016 4:15 PM

## 2016-05-27 MED ORDER — SODIUM CHLORIDE 0.9 % IV SOLN
1000.0000 mg | INTRAVENOUS | Status: AC
  Administered 2016-05-30: 1000 mg via INTRAVENOUS
  Filled 2016-05-27: qty 10

## 2016-05-29 MED ORDER — GABAPENTIN 300 MG PO CAPS: 300.0000 mg | ORAL_CAPSULE | Freq: Once | ORAL | Status: DC

## 2016-05-29 MED ORDER — BUPIVACAINE LIPOSOME 1.3 % IJ SUSP
20.0000 mL | INTRAMUSCULAR | Status: AC
  Administered 2016-05-30: 20 mL
  Filled 2016-05-29: qty 20

## 2016-05-29 MED ORDER — DEXAMETHASONE SODIUM PHOSPHATE 10 MG/ML IJ SOLN: 8.0000 mg | Freq: Once | INTRAMUSCULAR | Status: DC

## 2016-05-29 MED ORDER — DEXAMETHASONE SODIUM PHOSPHATE 10 MG/ML IJ SOLN
8.0000 mg | Freq: Once | INTRAMUSCULAR | Status: AC
  Administered 2016-05-30: 8 mg via INTRAVENOUS
  Filled 2016-05-29: qty 1

## 2016-05-29 MED ORDER — CEFAZOLIN SODIUM-DEXTROSE 2-4 GM/100ML-% IV SOLN
2.0000 g | INTRAVENOUS | Status: AC
  Administered 2016-05-30: 2 g via INTRAVENOUS
  Filled 2016-05-29: qty 100

## 2016-05-29 MED ORDER — GABAPENTIN 300 MG PO CAPS
300.0000 mg | ORAL_CAPSULE | Freq: Once | ORAL | Status: AC
  Administered 2016-05-30: 300 mg via ORAL
  Filled 2016-05-29: qty 1

## 2016-05-29 NOTE — H&P (Signed)
[name] MRN:  [id number] DOB/SEX:  [birth year]/female  CHIEF COMPLAINT:  Painful right Knee  HISTORY: Patient is a 66 y.o. female presented with a history of pain in the right knee. Onset of symptoms was gradual starting 2 years ago with gradually worsening course since that time. Prior procedures on the knee include arthroplasty. Patient has been treated conservatively with over-the-counter NSAIDs and activity modification. Patient currently rates pain in the knee at 10 out of 10 with activity. There is pain at night.  PAST MEDICAL HISTORY: Patient Active Problem List   Diagnosis Date Noted  . Asthma 11/12/2014  . Allergic rhinoconjunctivitis 11/12/2014  . GERD (gastroesophageal reflux disease) 11/12/2014   Past Medical History:  Diagnosis Date  . Anemia   . Anxiety   . Arthritis   . Asthma   . Depression   . GERD (gastroesophageal reflux disease)   . Heart murmur    Mitral Valve prolapse  . History of kidney stones   . Hypertension   . PONV (postoperative nausea and vomiting)    Past Surgical History:  Procedure Laterality Date  . ABDOMINAL HYSTERECTOMY    . JOINT REPLACEMENT     right and left knee replacement  . kidney stone removed    . TONSILLECTOMY    . WRIST ARTHROPLASTY Left      MEDICATIONS:   No prescriptions prior to admission.    ALLERGIES:   Allergies  Allergen Reactions  . No Known Allergies     REVIEW OF SYSTEMS:  A comprehensive review of systems was negative except for: Musculoskeletal: positive for myalgias and stiff joints   FAMILY HISTORY:   Family History  Problem Relation Age of Onset  . Heart attack Brother     SOCIAL HISTORY:   Social History  Substance Use Topics  . Smoking status: Never Smoker  . Smokeless tobacco: Never Used  . Alcohol use No     EXAMINATION:  Vital signs in last 24 hours:    There were no vitals taken for this visit.  General Appearance:    Alert, cooperative, no distress, appears stated age   Head:    Normocephalic, without obvious abnormality, atraumatic  Eyes:    PERRL, conjunctiva/corneas clear, EOM's intact, fundi    benign, both eyes  Ears:    Normal TM's and external ear canals, both ears  Nose:   Nares normal, septum midline, mucosa normal, no drainage    or sinus tenderness  Throat:   Lips, mucosa, and tongue normal; teeth and gums normal  Neck:   Supple, symmetrical, trachea midline, no adenopathy;    thyroid:  no enlargement/tenderness/nodules; no carotid   bruit or JVD  Back:     Symmetric, no curvature, ROM normal, no CVA tenderness  Lungs:     Clear to auscultation bilaterally, respirations unlabored  Chest Wall:    No tenderness or deformity   Heart:    Regular rate and rhythm, S1 and S2 normal, no murmur, rub   or gallop  Breast Exam:    No tenderness, masses, or nipple abnormality  Abdomen:     Soft, non-tender, bowel sounds active all four quadrants,    no masses, no organomegaly  Genitalia:    Normal female without lesion, discharge or tenderness  Rectal:    Normal tone, no masses or tenderness;   guaiac negative stool  Extremities:   Extremities normal, atraumatic, no cyanosis or edema  Pulses:   2+ and symmetric all extremities  Skin:  Skin color, texture, turgor normal, no rashes or lesions  Lymph nodes:   Cervical, supraclavicular, and axillary nodes normal  Neurologic:   CNII-XII intact, normal strength, sensation and reflexes    throughout    Musculoskeletal:  ROM 0-120, Ligaments intact,  Imaging Review Plain radiographs demonstrate failed TKA of the right knee. The overall alignment is neutral. The bone quality appears to be good for age and reported activity level.  Assessment/Plan: Failed TKA right knee  The patient history, physical examination and imaging studies are consistent with failed tka of the right knee. The patient has failed conservative treatment.  The clearance notes were reviewed.  After discussion with the patient it was  felt that Total Knee Revision was indicated. The procedure,  risks, and benefits of total knee revision were presented and reviewed. The risks including but not limited to aseptic loosening, infection, blood clots, vascular injury, stiffness, patella tracking problems complications among others were discussed. The patient acknowledged the explanation, agreed to proceed with the plan.  Donia Ast 05/29/2016, 9:09 PM

## 2016-05-30 ENCOUNTER — Encounter (HOSPITAL_COMMUNITY)
Admission: RE | Disposition: A | Discharge status: Home Health | Payer: Self-pay | Source: Ambulatory Visit | Attending: Orthopedic Surgery

## 2016-05-30 ENCOUNTER — Observation Stay (HOSPITAL_COMMUNITY)
Admission: RE | Admit: 2016-05-30 | Discharge: 2016-05-31 | Disposition: A | Discharge status: Home Health | Payer: Medicare Other | Source: Ambulatory Visit | Attending: Orthopedic Surgery | Admitting: Orthopedic Surgery

## 2016-05-30 ENCOUNTER — Inpatient Hospital Stay (HOSPITAL_COMMUNITY): Payer: Medicare Other | Admitting: Vascular Surgery

## 2016-05-30 ENCOUNTER — Encounter (HOSPITAL_COMMUNITY): Payer: Self-pay | Admitting: *Deleted

## 2016-05-30 ENCOUNTER — Inpatient Hospital Stay (HOSPITAL_COMMUNITY): Payer: Medicare Other | Admitting: Certified Registered Nurse Anesthetist

## 2016-05-30 DIAGNOSIS — K219 Gastro-esophageal reflux disease without esophagitis: Secondary | ICD-10-CM | POA: Insufficient documentation

## 2016-05-30 DIAGNOSIS — Z79899 Other long term (current) drug therapy: Secondary | ICD-10-CM | POA: Diagnosis not present

## 2016-05-30 DIAGNOSIS — F419 Anxiety disorder, unspecified: Secondary | ICD-10-CM | POA: Insufficient documentation

## 2016-05-30 DIAGNOSIS — T84032A Mechanical loosening of internal right knee prosthetic joint, initial encounter: Principal | ICD-10-CM | POA: Insufficient documentation

## 2016-05-30 DIAGNOSIS — F329 Major depressive disorder, single episode, unspecified: Secondary | ICD-10-CM | POA: Diagnosis not present

## 2016-05-30 DIAGNOSIS — Z7983 Long term (current) use of bisphosphonates: Secondary | ICD-10-CM | POA: Diagnosis not present

## 2016-05-30 DIAGNOSIS — J45909 Unspecified asthma, uncomplicated: Secondary | ICD-10-CM | POA: Insufficient documentation

## 2016-05-30 DIAGNOSIS — Y831 Surgical operation with implant of artificial internal device as the cause of abnormal reaction of the patient, or of later complication, without mention of misadventure at the time of the procedure: Secondary | ICD-10-CM | POA: Insufficient documentation

## 2016-05-30 DIAGNOSIS — Z96659 Presence of unspecified artificial knee joint: Secondary | ICD-10-CM

## 2016-05-30 DIAGNOSIS — T8484XA Pain due to internal orthopedic prosthetic devices, implants and grafts, initial encounter: Secondary | ICD-10-CM | POA: Diagnosis present

## 2016-05-30 DIAGNOSIS — I10 Essential (primary) hypertension: Secondary | ICD-10-CM | POA: Diagnosis not present

## 2016-05-30 DIAGNOSIS — G8918 Other acute postprocedural pain: Secondary | ICD-10-CM | POA: Diagnosis not present

## 2016-05-30 DIAGNOSIS — Z96651 Presence of right artificial knee joint: Secondary | ICD-10-CM | POA: Diagnosis not present

## 2016-05-30 HISTORY — PX: TOTAL KNEE REVISION: SHX996

## 2016-05-30 SURGERY — TOTAL KNEE REVISION
Anesthesia: Regional | Laterality: Right

## 2016-05-30 MED ORDER — SODIUM CHLORIDE 0.9 % IV SOLN
INTRAVENOUS | Status: DC
  Administered 2016-05-30: 15:00:00 via INTRAVENOUS

## 2016-05-30 MED ORDER — BUDESONIDE 0.25 MG/2ML IN SUSP: 0.2500 mg | Freq: Two times a day (BID) | RESPIRATORY_TRACT | Status: DC | PRN

## 2016-05-30 MED ORDER — ACETAMINOPHEN 325 MG PO TABS: 650.0000 mg | ORAL_TABLET | Freq: Four times a day (QID) | ORAL | Status: DC | PRN

## 2016-05-30 MED ORDER — FENTANYL CITRATE (PF) 100 MCG/2ML IJ SOLN
100.0000 ug | Freq: Once | INTRAMUSCULAR | Status: AC
  Administered 2016-05-30: 50 ug via INTRAVENOUS

## 2016-05-30 MED ORDER — PRAVASTATIN SODIUM 40 MG PO TABS
40.0000 mg | ORAL_TABLET | Freq: Every day | ORAL | Status: DC
  Administered 2016-05-30: 40 mg via ORAL
  Filled 2016-05-30: qty 1

## 2016-05-30 MED ORDER — ONDANSETRON HCL 4 MG PO TABS: 4.0000 mg | ORAL_TABLET | Freq: Four times a day (QID) | ORAL | Status: DC | PRN

## 2016-05-30 MED ORDER — PROPOFOL 500 MG/50ML IV EMUL
INTRAVENOUS | Status: DC | PRN
  Administered 2016-05-30: 50 ug/kg/min via INTRAVENOUS

## 2016-05-30 MED ORDER — METHOCARBAMOL 500 MG PO TABS
500.0000 mg | ORAL_TABLET | Freq: Four times a day (QID) | ORAL | Status: DC | PRN
  Administered 2016-05-30: 500 mg via ORAL
  Filled 2016-05-30: qty 1

## 2016-05-30 MED ORDER — METHOCARBAMOL 1000 MG/10ML IJ SOLN
500.0000 mg | Freq: Four times a day (QID) | INTRAVENOUS | Status: DC | PRN
  Filled 2016-05-30: qty 5

## 2016-05-30 MED ORDER — FENTANYL CITRATE (PF) 100 MCG/2ML IJ SOLN
INTRAMUSCULAR | Status: AC
  Filled 2016-05-30: qty 2

## 2016-05-30 MED ORDER — GLYCOPYRROLATE 0.2 MG/ML IJ SOLN
INTRAMUSCULAR | Status: DC | PRN
  Administered 2016-05-30: 0.2 mg via INTRAVENOUS
  Administered 2016-05-30: 0.1 mg via INTRAVENOUS

## 2016-05-30 MED ORDER — SODIUM CHLORIDE 0.9 % IR SOLN
Status: DC | PRN
  Administered 2016-05-30: 1000 mL

## 2016-05-30 MED ORDER — TRANEXAMIC ACID 1000 MG/10ML IV SOLN
1000.0000 mg | Freq: Once | INTRAVENOUS | Status: AC
  Administered 2016-05-30: 1000 mg via INTRAVENOUS
  Filled 2016-05-30: qty 10

## 2016-05-30 MED ORDER — SODIUM CHLORIDE 0.9 % IV SOLN: INTRAVENOUS | Status: DC | PRN

## 2016-05-30 MED ORDER — PHENYLEPHRINE HCL 10 MG/ML IJ SOLN
INTRAVENOUS | Status: DC | PRN
  Administered 2016-05-30: 20 ug/min via INTRAVENOUS

## 2016-05-30 MED ORDER — APREMILAST 30 MG PO TABS: 30.0000 mg | ORAL_TABLET | Freq: Every day | ORAL | Status: DC

## 2016-05-30 MED ORDER — DIPHENHYDRAMINE HCL 12.5 MG/5ML PO ELIX: 12.5000 mg | ORAL_SOLUTION | ORAL | Status: DC | PRN

## 2016-05-30 MED ORDER — METOCLOPRAMIDE HCL 5 MG/ML IJ SOLN
5.0000 mg | Freq: Three times a day (TID) | INTRAMUSCULAR | Status: DC | PRN
  Administered 2016-05-30: 10 mg via INTRAVENOUS
  Filled 2016-05-30: qty 2

## 2016-05-30 MED ORDER — TRANEXAMIC ACID 1000 MG/10ML IV SOLN
2000.0000 mg | INTRAVENOUS | Status: AC
  Administered 2016-05-30: 2000 mg via TOPICAL
  Filled 2016-05-30: qty 20

## 2016-05-30 MED ORDER — ONDANSETRON HCL 4 MG/2ML IJ SOLN: 4.0000 mg | Freq: Once | INTRAMUSCULAR | Status: DC | PRN

## 2016-05-30 MED ORDER — EPINEPHRINE PF 1 MG/ML IJ SOLN
INTRAMUSCULAR | Status: AC
  Filled 2016-05-30: qty 1

## 2016-05-30 MED ORDER — MONTELUKAST SODIUM 10 MG PO TABS
10.0000 mg | ORAL_TABLET | Freq: Every day | ORAL | Status: DC
  Administered 2016-05-30: 10 mg via ORAL
  Filled 2016-05-30: qty 1

## 2016-05-30 MED ORDER — BENAZEPRIL HCL 20 MG PO TABS
40.0000 mg | ORAL_TABLET | Freq: Every day | ORAL | Status: DC
  Administered 2016-05-30: 40 mg via ORAL
  Filled 2016-05-30: qty 2

## 2016-05-30 MED ORDER — PROPOFOL 10 MG/ML IV BOLUS
INTRAVENOUS | Status: AC
  Filled 2016-05-30: qty 20

## 2016-05-30 MED ORDER — LIDOCAINE 2% (20 MG/ML) 5 ML SYRINGE
INTRAMUSCULAR | Status: AC
  Filled 2016-05-30: qty 5

## 2016-05-30 MED ORDER — LACTATED RINGERS IV SOLN
INTRAVENOUS | Status: DC
  Administered 2016-05-30 (×2): via INTRAVENOUS

## 2016-05-30 MED ORDER — PROPOFOL 10 MG/ML IV BOLUS
INTRAVENOUS | Status: DC | PRN
  Administered 2016-05-30: 30 mg via INTRAVENOUS
  Administered 2016-05-30: 170 mg via INTRAVENOUS

## 2016-05-30 MED ORDER — HYDROMORPHONE HCL 2 MG/ML IJ SOLN: 1.0000 mg | INTRAMUSCULAR | Status: DC | PRN

## 2016-05-30 MED ORDER — ASPIRIN EC 325 MG PO TBEC
325.0000 mg | DELAYED_RELEASE_TABLET | Freq: Two times a day (BID) | ORAL | Status: DC
  Administered 2016-05-30 – 2016-05-31 (×3): 325 mg via ORAL
  Filled 2016-05-30 (×3): qty 1

## 2016-05-30 MED ORDER — PHENYLEPHRINE 40 MCG/ML (10ML) SYRINGE FOR IV PUSH (FOR BLOOD PRESSURE SUPPORT)
PREFILLED_SYRINGE | INTRAVENOUS | Status: AC
  Filled 2016-05-30: qty 10

## 2016-05-30 MED ORDER — PHENYLEPHRINE HCL 10 MG/ML IJ SOLN
INTRAMUSCULAR | Status: DC | PRN
  Administered 2016-05-30: 60 ug via INTRAVENOUS

## 2016-05-30 MED ORDER — ONDANSETRON HCL 4 MG/2ML IJ SOLN
4.0000 mg | Freq: Four times a day (QID) | INTRAMUSCULAR | Status: DC | PRN
  Administered 2016-05-30: 4 mg via INTRAVENOUS
  Filled 2016-05-30 (×2): qty 2

## 2016-05-30 MED ORDER — DEXAMETHASONE SODIUM PHOSPHATE 10 MG/ML IJ SOLN
10.0000 mg | Freq: Once | INTRAMUSCULAR | Status: AC
  Administered 2016-05-31: 10 mg via INTRAVENOUS
  Filled 2016-05-30: qty 1

## 2016-05-30 MED ORDER — FENTANYL CITRATE (PF) 100 MCG/2ML IJ SOLN
INTRAMUSCULAR | Status: DC | PRN
  Administered 2016-05-30: 25 ug via INTRAVENOUS
  Administered 2016-05-30: 50 ug via INTRAVENOUS
  Administered 2016-05-30: 25 ug via INTRAVENOUS

## 2016-05-30 MED ORDER — CELECOXIB 200 MG PO CAPS
200.0000 mg | ORAL_CAPSULE | Freq: Two times a day (BID) | ORAL | Status: DC
  Administered 2016-05-30 – 2016-05-31 (×3): 200 mg via ORAL
  Filled 2016-05-30 (×3): qty 1

## 2016-05-30 MED ORDER — MEPERIDINE HCL 25 MG/ML IJ SOLN: 6.2500 mg | INTRAMUSCULAR | Status: DC | PRN

## 2016-05-30 MED ORDER — METOCLOPRAMIDE HCL 5 MG PO TABS: 5.0000 mg | ORAL_TABLET | Freq: Three times a day (TID) | ORAL | Status: DC | PRN

## 2016-05-30 MED ORDER — MIDAZOLAM HCL 2 MG/2ML IJ SOLN
2.0000 mg | Freq: Once | INTRAMUSCULAR | Status: AC
  Administered 2016-05-30: 2 mg via INTRAVENOUS

## 2016-05-30 MED ORDER — HYDROMORPHONE HCL 1 MG/ML IJ SOLN
0.2500 mg | INTRAMUSCULAR | Status: DC | PRN
  Administered 2016-05-30: 0.5 mg via INTRAVENOUS

## 2016-05-30 MED ORDER — DOCUSATE SODIUM 100 MG PO CAPS
100.0000 mg | ORAL_CAPSULE | Freq: Two times a day (BID) | ORAL | Status: DC
  Administered 2016-05-30 – 2016-05-31 (×3): 100 mg via ORAL
  Filled 2016-05-30 (×3): qty 1

## 2016-05-30 MED ORDER — BUPIVACAINE-EPINEPHRINE 0.25% -1:200000 IJ SOLN
INTRAMUSCULAR | Status: DC | PRN
  Administered 2016-05-30: 20 mL

## 2016-05-30 MED ORDER — AMLODIPINE BESYLATE 5 MG PO TABS
5.0000 mg | ORAL_TABLET | Freq: Every day | ORAL | Status: DC
  Administered 2016-05-30: 5 mg via ORAL
  Filled 2016-05-30: qty 1

## 2016-05-30 MED ORDER — BUSPIRONE HCL 10 MG PO TABS: 30.0000 mg | ORAL_TABLET | Freq: Every day | ORAL | Status: DC | PRN

## 2016-05-30 MED ORDER — MENTHOL 3 MG MT LOZG: 1.0000 | LOZENGE | OROMUCOSAL | Status: DC | PRN

## 2016-05-30 MED ORDER — ALUM & MAG HYDROXIDE-SIMETH 200-200-20 MG/5ML PO SUSP: 30.0000 mL | ORAL | Status: DC | PRN

## 2016-05-30 MED ORDER — GABAPENTIN 300 MG PO CAPS
300.0000 mg | ORAL_CAPSULE | Freq: Three times a day (TID) | ORAL | Status: DC
  Administered 2016-05-30 – 2016-05-31 (×3): 300 mg via ORAL
  Filled 2016-05-30: qty 1
  Filled 2016-05-30: qty 3
  Filled 2016-05-30: qty 1

## 2016-05-30 MED ORDER — ZOLPIDEM TARTRATE 5 MG PO TABS: 5.0000 mg | ORAL_TABLET | Freq: Every evening | ORAL | Status: DC | PRN

## 2016-05-30 MED ORDER — METHOCARBAMOL 500 MG PO TABS
ORAL_TABLET | ORAL | Status: AC
  Filled 2016-05-30: qty 1

## 2016-05-30 MED ORDER — ACETAMINOPHEN 650 MG RE SUPP: 650.0000 mg | Freq: Four times a day (QID) | RECTAL | Status: DC | PRN

## 2016-05-30 MED ORDER — HYDROCODONE-ACETAMINOPHEN 7.5-325 MG PO TABS
1.0000 | ORAL_TABLET | Freq: Four times a day (QID) | ORAL | Status: DC
  Administered 2016-05-30 – 2016-05-31 (×5): 1 via ORAL
  Filled 2016-05-30 (×5): qty 1

## 2016-05-30 MED ORDER — HYDROMORPHONE HCL 1 MG/ML IJ SOLN
INTRAMUSCULAR | Status: AC
  Filled 2016-05-30: qty 0.5

## 2016-05-30 MED ORDER — BUPIVACAINE HCL (PF) 0.25 % IJ SOLN
INTRAMUSCULAR | Status: AC
  Filled 2016-05-30: qty 30

## 2016-05-30 MED ORDER — FAMOTIDINE 20 MG PO TABS
10.0000 mg | ORAL_TABLET | Freq: Every day | ORAL | Status: DC
  Administered 2016-05-30 – 2016-05-31 (×2): 10 mg via ORAL
  Filled 2016-05-30 (×2): qty 1

## 2016-05-30 MED ORDER — FLUOXETINE HCL 20 MG PO CAPS
40.0000 mg | ORAL_CAPSULE | Freq: Every day | ORAL | Status: DC
  Administered 2016-05-30 – 2016-05-31 (×2): 40 mg via ORAL
  Filled 2016-05-30 (×3): qty 2

## 2016-05-30 MED ORDER — SENNOSIDES-DOCUSATE SODIUM 8.6-50 MG PO TABS: 1.0000 | ORAL_TABLET | Freq: Every evening | ORAL | Status: DC | PRN

## 2016-05-30 MED ORDER — AMLODIPINE BESY-BENAZEPRIL HCL 5-40 MG PO CAPS: 1.0000 | ORAL_CAPSULE | Freq: Every evening | ORAL | Status: DC

## 2016-05-30 MED ORDER — MOMETASONE FURO-FORMOTEROL FUM 200-5 MCG/ACT IN AERO
2.0000 | INHALATION_SPRAY | Freq: Two times a day (BID) | RESPIRATORY_TRACT | Status: DC
  Filled 2016-05-30: qty 8.8

## 2016-05-30 MED ORDER — BISACODYL 5 MG PO TBEC: 5.0000 mg | DELAYED_RELEASE_TABLET | Freq: Every day | ORAL | Status: DC | PRN

## 2016-05-30 MED ORDER — OXYCODONE HCL 5 MG PO TABS
5.0000 mg | ORAL_TABLET | ORAL | Status: DC | PRN
  Administered 2016-05-30 – 2016-05-31 (×2): 10 mg via ORAL
  Filled 2016-05-30: qty 2

## 2016-05-30 MED ORDER — FENTANYL CITRATE (PF) 100 MCG/2ML IJ SOLN
INTRAMUSCULAR | Status: AC
Start: 2016-05-30 — End: 2016-05-30
  Filled 2016-05-30: qty 2

## 2016-05-30 MED ORDER — MIDAZOLAM HCL 2 MG/2ML IJ SOLN
INTRAMUSCULAR | Status: AC
  Filled 2016-05-30: qty 2

## 2016-05-30 MED ORDER — PHENOL 1.4 % MT LIQD: 1.0000 | OROMUCOSAL | Status: DC | PRN

## 2016-05-30 MED ORDER — CEFAZOLIN IN D5W 1 GM/50ML IV SOLN
1.0000 g | Freq: Four times a day (QID) | INTRAVENOUS | Status: AC
  Administered 2016-05-30 (×2): 1 g via INTRAVENOUS
  Filled 2016-05-30 (×2): qty 50

## 2016-05-30 MED ORDER — EPHEDRINE SULFATE 50 MG/ML IJ SOLN
INTRAMUSCULAR | Status: DC | PRN
  Administered 2016-05-30: 10 mg via INTRAVENOUS

## 2016-05-30 MED ORDER — OXYCODONE HCL 5 MG PO TABS
ORAL_TABLET | ORAL | Status: AC
  Filled 2016-05-30: qty 2

## 2016-05-30 MED ORDER — FLEET ENEMA 7-19 GM/118ML RE ENEM: 1.0000 | ENEMA | Freq: Once | RECTAL | Status: DC | PRN

## 2016-05-30 SURGICAL SUPPLY — 88 items
ARTI SURFACE ZIMMER (Orthopedic Implant) ×2 IMPLANT
BANDAGE ESMARK 6X9 LF (GAUZE/BANDAGES/DRESSINGS) ×1 IMPLANT
BLADE SAGITTAL 13X1.27X60 (BLADE) ×1 IMPLANT
BLADE SAW SAG 9X24 (BLADE) ×1 IMPLANT
BLADE SAW SGTL 13X75X1.27 (BLADE) ×1 IMPLANT
BLADE SAW SGTL 83.5X18.5 (BLADE) ×2 IMPLANT
BLADE SAW SGTL NAR THIN XSHT (BLADE) ×1 IMPLANT
BLADE SURG 10 STRL SS (BLADE) ×6 IMPLANT
BNDG CMPR 9X6 STRL LF SNTH (GAUZE/BANDAGES/DRESSINGS) ×1
BNDG ESMARK 6X9 LF (GAUZE/BANDAGES/DRESSINGS) ×2
BONE CEMENT PALACOS R-G (Cement) ×4 IMPLANT
BOWL SMART MIX CTS (DISPOSABLE) IMPLANT
CEMENT BONE PALACOS R-G (Cement) IMPLANT
COVER SURGICAL LIGHT HANDLE (MISCELLANEOUS) ×2 IMPLANT
CUFF TOURNIQUET SINGLE 34IN LL (TOURNIQUET CUFF) IMPLANT
DISTAL AUG ZIMMER (Orthopedic Implant) ×2 IMPLANT
DRAPE HALF SHEET 40X57 (DRAPES) ×1 IMPLANT
DRAPE IMP U-DRAPE 54X76 (DRAPES) ×1 IMPLANT
DRAPE INCISE IOBAN 66X45 STRL (DRAPES) ×4 IMPLANT
DRAPE PROXIMA HALF (DRAPES) ×2 IMPLANT
DRAPE U-SHAPE 47X51 STRL (DRAPES) ×2 IMPLANT
DRSG ADAPTIC 3X8 NADH LF (GAUZE/BANDAGES/DRESSINGS) ×2 IMPLANT
DRSG AQUACEL AG ADV 3.5X10 (GAUZE/BANDAGES/DRESSINGS) ×1 IMPLANT
DRSG PAD ABDOMINAL 8X10 ST (GAUZE/BANDAGES/DRESSINGS) ×1 IMPLANT
DURAPREP 26ML APPLICATOR (WOUND CARE) ×4 IMPLANT
ELECT REM PT RETURN 9FT ADLT (ELECTROSURGICAL) ×2
ELECTRODE REM PT RTRN 9FT ADLT (ELECTROSURGICAL) ×1 IMPLANT
EVACUATOR 1/8 PVC DRAIN (DRAIN) ×1 IMPLANT
FACESHIELD WRAPAROUND (MASK) ×2 IMPLANT
FACESHIELD WRAPAROUND OR TEAM (MASK) ×1 IMPLANT
FEMORAL COMP ZIMMER KNEE (Orthopedic Implant) ×1 IMPLANT
GAUZE SPONGE 4X4 12PLY STRL (GAUZE/BANDAGES/DRESSINGS) ×1 IMPLANT
GLOVE BIOGEL M 7.0 STRL (GLOVE) ×2 IMPLANT
GLOVE BIOGEL PI IND STRL 7.5 (GLOVE) IMPLANT
GLOVE BIOGEL PI IND STRL 8.5 (GLOVE) ×4 IMPLANT
GLOVE BIOGEL PI INDICATOR 7.5 (GLOVE)
GLOVE BIOGEL PI INDICATOR 8.5 (GLOVE) ×4
GLOVE BIOGEL PI ORTHO PRO SZ8 (GLOVE) ×1
GLOVE PI ORTHO PRO STRL SZ8 (GLOVE) ×1 IMPLANT
GLOVE SURG ORTHO 8.0 STRL STRW (GLOVE) ×9 IMPLANT
GOWN STRL REUS W/ TWL LRG LVL3 (GOWN DISPOSABLE) ×2 IMPLANT
GOWN STRL REUS W/ TWL XL LVL3 (GOWN DISPOSABLE) ×2 IMPLANT
GOWN STRL REUS W/TWL 2XL LVL3 (GOWN DISPOSABLE) ×2 IMPLANT
GOWN STRL REUS W/TWL LRG LVL3 (GOWN DISPOSABLE) ×4
GOWN STRL REUS W/TWL XL LVL3 (GOWN DISPOSABLE) ×4
HANDPIECE INTERPULSE COAX TIP (DISPOSABLE) ×2
HOOD PEEL AWAY FACE SHEILD DIS (HOOD) ×8 IMPLANT
IMMOBILIZER KNEE 22  40 CIR (ORTHOPEDIC SUPPLIES) ×1
IMMOBILIZER KNEE 22 40 CIR (ORTHOPEDIC SUPPLIES) IMPLANT
KIT BASIN OR (CUSTOM PROCEDURE TRAY) ×2 IMPLANT
KIT ROOM TURNOVER OR (KITS) ×2 IMPLANT
MANIFOLD NEPTUNE II (INSTRUMENTS) ×2 IMPLANT
NDL 18GX1X1/2 (RX/OR ONLY) (NEEDLE) IMPLANT
NEEDLE 18GX1X1/2 (RX/OR ONLY) (NEEDLE) IMPLANT
NEEDLE 22X1 1/2 (OR ONLY) (NEEDLE) ×4 IMPLANT
NS IRRIG 1000ML POUR BTL (IV SOLUTION) ×2 IMPLANT
PACK TOTAL JOINT (CUSTOM PROCEDURE TRAY) ×2 IMPLANT
PACK UNIVERSAL I (CUSTOM PROCEDURE TRAY) ×1 IMPLANT
PAD ARMBOARD 7.5X6 YLW CONV (MISCELLANEOUS) ×4 IMPLANT
PADDING CAST COTTON 6X4 STRL (CAST SUPPLIES) ×1 IMPLANT
SET HNDPC FAN SPRY TIP SCT (DISPOSABLE) IMPLANT
SRFC ARTC 1-2 C-D MIC 58X40X10 (Orthopedic Implant) ×1 IMPLANT
STAPLER VISISTAT 35W (STAPLE) ×1 IMPLANT
STEM ST EXT ZIER 100X145X10X (Stem) IMPLANT
STEM ST EXT ZIER 100X145X12X (Stem) IMPLANT
STEM ST EXT ZIMMER (Stem) ×4 IMPLANT
STEM TIBIAL COMPONENT SZ 2 (Stem) ×1 IMPLANT
SUCTION FRAZIER HANDLE 10FR (MISCELLANEOUS)
SUCTION TUBE FRAZIER 10FR DISP (MISCELLANEOUS) ×1 IMPLANT
SURFACE ARTC 1-2 C-D 58X40X10 (Orthopedic Implant) IMPLANT
SUT BONE WAX W31G (SUTURE) ×2 IMPLANT
SUT PDS AB 0 CT 36 (SUTURE) IMPLANT
SUT PDS AB 1 CT  36 (SUTURE)
SUT PDS AB 1 CT 36 (SUTURE) IMPLANT
SUT PDS AB 2-0 CT1 27 (SUTURE) IMPLANT
SUT VIC AB 0 CTB1 27 (SUTURE) IMPLANT
SUT VIC AB 1 CT1 27 (SUTURE) ×6
SUT VIC AB 1 CT1 27XBRD ANBCTR (SUTURE) IMPLANT
SUT VIC AB 2-0 CTB1 (SUTURE) ×2 IMPLANT
SWAB COLLECTION DEVICE MRSA (MISCELLANEOUS) ×1 IMPLANT
SYR 20CC LL (SYRINGE) ×4 IMPLANT
SYR CONTROL 10ML LL (SYRINGE) ×2 IMPLANT
TOWEL OR 17X24 6PK STRL BLUE (TOWEL DISPOSABLE) ×1 IMPLANT
TOWEL OR 17X26 10 PK STRL BLUE (TOWEL DISPOSABLE) ×1 IMPLANT
TRAY FOLEY W/METER SILVER 16FR (SET/KITS/TRAYS/PACK) ×1 IMPLANT
TUBE ANAEROBIC SPECIMEN COL (MISCELLANEOUS) IMPLANT
WATER STERILE IRR 1000ML POUR (IV SOLUTION) ×3 IMPLANT
WRAP KNEE MAXI GEL POST OP (GAUZE/BANDAGES/DRESSINGS) ×2 IMPLANT

## 2016-05-30 NOTE — Anesthesia Procedure Notes (Signed)
Procedure Name: LMA Insertion Date/Time: 05/30/2016 10:34 AM Performed by: Valda Favia Pre-anesthesia Checklist: Patient identified, Emergency Drugs available, Suction available, Patient being monitored and Timeout performed Patient Re-evaluated:Patient Re-evaluated prior to inductionOxygen Delivery Method: Circle system utilized Preoxygenation: Pre-oxygenation with 100% oxygen Intubation Type: IV induction LMA: LMA inserted LMA Size: 4.0 Number of attempts: 1 Placement Confirmation: positive ETCO2 and breath sounds checked- equal and bilateral Tube secured with: Tape Dental Injury: Teeth and Oropharynx as per pre-operative assessment

## 2016-05-30 NOTE — Anesthesia Preprocedure Evaluation (Signed)
Anesthesia Evaluation  Patient identified by MRN, date of birth, ID band Patient awake    Reviewed: Allergy & Precautions, NPO status , Patient's Chart, lab work & pertinent test results  History of Anesthesia Complications (+) PONV  Airway Mallampati: I  TM Distance: >3 FB Neck ROM: Full    Dental   Pulmonary asthma ,    Pulmonary exam normal        Cardiovascular hypertension, Normal cardiovascular exam     Neuro/Psych    GI/Hepatic GERD  Medicated and Controlled,  Endo/Other    Renal/GU      Musculoskeletal   Abdominal   Peds  Hematology   Anesthesia Other Findings   Reproductive/Obstetrics                             Anesthesia Physical Anesthesia Plan  ASA: II  Anesthesia Plan: Spinal and MAC   Post-op Pain Management:    Induction: Intravenous  Airway Management Planned: Simple Face Mask  Additional Equipment:   Intra-op Plan:   Post-operative Plan:   Informed Consent: I have reviewed the patients History and Physical, chart, labs and discussed the procedure including the risks, benefits and alternatives for the proposed anesthesia with the patient or authorized representative who has indicated his/her understanding and acceptance.     Plan Discussed with: CRNA and Surgeon  Anesthesia Plan Comments:         Anesthesia Quick Evaluation

## 2016-05-30 NOTE — Transfer of Care (Signed)
Immediate Anesthesia Transfer of Care Note  Patient: [name]  Procedure(s) Performed: Procedure(s): TOTAL KNEE REVISION (Right)  Patient Location: PACU  Anesthesia Type:GA combined with regional for post-op pain  Level of Consciousness: awake  Airway & Oxygen Therapy: Patient Spontanous Breathing  Post-op Assessment: Report given to RN and Post -op Vital signs reviewed and stable  Post vital signs: Reviewed and stable  Last Vitals:  Vitals:   05/30/16 0705 05/30/16 1255  BP: 100/64 115/65  Pulse: 62 72  Resp: 20 17  Temp: 37 C 36.4 C    Last Pain:  Vitals:   05/30/16 0802  PainSc: 0-No pain      Patients Stated Pain Goal: 2 (13/08/65 7846)  Complications: No apparent anesthesia complications

## 2016-05-30 NOTE — Progress Notes (Signed)
Orthopedic Tech Progress Note Patient Details:  [name] 1950/12/22 [id number]  CPM Right Knee CPM Right Knee: On Right Knee Flexion (Degrees): 90 Right Knee Extension (Degrees): 0 Additional Comments: trapeze bar patient helper   [name] 05/30/2016, 1:17 PM Viewed order from doctor's order list

## 2016-05-30 NOTE — Evaluation (Signed)
Physical Therapy Evaluation Patient Details Name: [name] MRN: [id number] DOB: 07-22-1950 Today's Date: 05/30/2016   History of Present Illness  Admitted for RTKA, WBAT;  has a pertinent past medical history of Anemia; Anxiety; Arthritis; Asthma; Depression; Hypertension; and PONV (postoperative nausea and vomiting).  has a pertinent past surgical history that includes  Joint replacement; Wrist Arthroplasty (Left)  Clinical Impression   Pt is s/p TKA resulting in the deficits listed below (see PT Problem List). Overall moving well; KI was ordered and we opted to use it today, POD Zero; will continue to assess for R knee stability in stance, and hopefully Ms. Holben will not need KI for long;  Pt will benefit from skilled PT to increase their independence and safety with mobility to allow discharge to the venue listed below.      Follow Up Recommendations Home health PT;Supervision/Assistance - 24 hour    Equipment Recommendations  None recommended by PT (Well-equipped)    Recommendations for Other Services       Precautions / Restrictions Precautions Precautions: Knee Precaution Comments: Pt educated to not allow any pillow or bolster under knee for healing with optimal range of motion.  Required Braces or Orthoses: Knee Immobilizer - Right Knee Immobilizer - Right: Other (comment) (KI ordered; used on POD 0) Restrictions Weight Bearing Restrictions: Yes RLE Weight Bearing: Weight bearing as tolerated      Mobility  Bed Mobility Overal bed mobility: Needs Assistance Bed Mobility: Supine to Sit     Supine to sit: Supervision     General bed mobility comments: Supervision mostly for line management  Transfers Overall transfer level: Needs assistance Equipment used: Rolling walker (2 wheeled) Transfers: Sit to/from Stand Sit to Stand: Min assist         General transfer comment: Min assist to power up and steady  Ambulation/Gait Ambulation/Gait assistance:  Min guard Ambulation Distance (Feet): 10 Feet Assistive device: Rolling walker (2 wheeled) Gait Pattern/deviations: Step-through pattern;Decreased stance time - right;Decreased step length - left     General Gait Details: Cues to self-monitor for activity tolerance; Pain limiting amb distance today  Stairs            Wheelchair Mobility    Modified Rankin (Stroke Patients Only)       Balance                                             Pertinent Vitals/Pain Pain Assessment: 0-10 Pain Score: 8  Pain Location: R knee Pain Descriptors / Indicators: Aching Pain Intervention(s): Limited activity within patient's tolerance;Monitored during session    Home Living Family/patient expects to be discharged to:: Private residence Living Arrangements: Spouse/significant other Available Help at Discharge: Family;Available 24 hours/day Type of Home: House Home Access: Ramped entrance     Home Layout: One level Home Equipment: Walker - 2 wheels;Bedside commode      Prior Function Level of Independence: Independent               Hand Dominance        Extremity/Trunk Assessment   Upper Extremity Assessment Upper Extremity Assessment: Overall WFL for tasks assessed    Lower Extremity Assessment Lower Extremity Assessment: RLE deficits/detail RLE Deficits / Details: Grossly decr AROM and strength R knee postop; notable quad lag with straight leg raise       Communication   Communication:  No difficulties  Cognition Arousal/Alertness: Awake/alert Behavior During Therapy: WFL for tasks assessed/performed Overall Cognitive Status: Within Functional Limits for tasks assessed                                        General Comments      Exercises     Assessment/Plan    PT Assessment Patient needs continued PT services  PT Problem List Decreased strength;Decreased range of motion;Decreased activity tolerance;Decreased  balance;Decreased mobility;Decreased knowledge of use of DME;Decreased knowledge of precautions;Pain       PT Treatment Interventions DME instruction;Gait training;Functional mobility training;Therapeutic activities;Stair training;Therapeutic exercise;Balance training;Patient/family education    PT Goals (Current goals can be found in the Care Plan section)  Acute Rehab PT Goals Patient Stated Goal: back to church PT Goal Formulation: With patient Time For Goal Achievement: 06/06/16 Potential to Achieve Goals: Good    Frequency 7X/week   Barriers to discharge        Co-evaluation               End of Session Equipment Utilized During Treatment: Gait belt;Right knee immobilizer Activity Tolerance: Patient tolerated treatment well Patient left: in chair;with call bell/phone within reach;with family/visitor present Nurse Communication: Mobility status PT Visit Diagnosis: Other abnormalities of gait and mobility (R26.89);Pain Pain - Right/Left: Right Pain - part of body: Knee    Time: 1553-1610 PT Time Calculation (min) (ACUTE ONLY): 17 min   Charges:   PT Evaluation $PT Eval Low Complexity: 1 Procedure     PT G Codes:   PT G-Codes **NOT FOR INPATIENT CLASS** Functional Assessment Tool Used: Clinical judgement Functional Limitation: Mobility: Walking and moving around Mobility: Walking and Moving Around Current Status (R3202): At least 1 percent but less than 20 percent impaired, limited or restricted Mobility: Walking and Moving Around Goal Status [phone]): 0 percent impaired, limited or restricted    Roney Marion, PT  [place] Pager [phone] Office [place] 05/30/2016, 4:45 PM

## 2016-05-30 NOTE — Anesthesia Procedure Notes (Signed)
Spinal  Patient location during procedure: OR Start time: 05/30/2016 10:07 AM End time: 05/30/2016 10:10 AM Staffing Anesthesiologist: Lillia Abed Performed: anesthesiologist  Preanesthetic Checklist Completed: patient identified, surgical consent, pre-op evaluation, timeout performed, IV checked, risks and benefits discussed and monitors and equipment checked Spinal Block Patient position: sitting Prep: DuraPrep Patient monitoring: heart rate, cardiac monitor, continuous pulse ox and blood pressure Approach: right paramedian Location: L3-4 Injection technique: single-shot Needle Needle type: Pencan  Needle gauge: 24 G Needle length: 9 cm Needle insertion depth: 6 cm

## 2016-05-30 NOTE — Anesthesia Postprocedure Evaluation (Addendum)
Anesthesia Post Note  Patient: [name]  Procedure(s) Performed: Procedure(s) (LRB): TOTAL KNEE REVISION (Right)  Patient location during evaluation: PACU Anesthesia Type: Regional Level of consciousness: awake Pain management: satisfactory to patient Vital Signs Assessment: post-procedure vital signs reviewed and stable Respiratory status: spontaneous breathing Cardiovascular status: blood pressure returned to baseline Postop Assessment: no headache and spinal receding Anesthetic complications: no       Last Vitals:  Vitals:   05/30/16 1255 05/30/16 1310  BP: 115/65 98/61  Pulse: 72 69  Resp: 17 19  Temp: 36.4 C     Last Pain:  Vitals:   05/30/16 0802  PainSc: 0-No pain                 Jay Kempe EDWARD

## 2016-05-30 NOTE — Anesthesia Procedure Notes (Signed)
Anesthesia Regional Block: Adductor canal block   Pre-Anesthetic Checklist: ,, timeout performed, Correct Patient, Correct Site, Correct Laterality, Correct Procedure, Correct Position, site marked, Risks and benefits discussed,  Surgical consent,  Pre-op evaluation,  At surgeon's request and post-op pain management  Laterality: Right  Prep: chloraprep       Needles:  Injection technique: Single-shot  Needle Type: Echogenic Stimulator Needle     Needle Length: 9cm  Needle Gauge: 21     Additional Needles:   Procedures: ultrasound guided,,,,,,,,  Narrative:  Start time: 05/30/2016 9:40 AM End time: 05/30/2016 9:50 AM Injection made incrementally with aspirations every 5 mL.  Performed by: Personally  Anesthesiologist: Lillia Abed  Additional Notes: Monitors applied. Patient sedated. Sterile prep and drape,hand hygiene and sterile gloves were used. Relevant anatomy identified.Needle position confirmed.Local anesthetic injected incrementally after negative aspiration. Local anesthetic spread visualized around nerve(s). Vascular puncture avoided. No complications. Image printed for medical record.The patient tolerated the procedure well.    Lillia Abed MD

## 2016-05-31 ENCOUNTER — Encounter (HOSPITAL_COMMUNITY): Payer: Self-pay | Admitting: Orthopedic Surgery

## 2016-05-31 DIAGNOSIS — T84032A Mechanical loosening of internal right knee prosthetic joint, initial encounter: Secondary | ICD-10-CM | POA: Diagnosis not present

## 2016-05-31 LAB — CBC
HCT: 32.8 % — ABNORMAL LOW (ref 36.0–46.0)
HEMOGLOBIN: 10.2 g/dL — AB (ref 12.0–15.0)
MCH: 25.4 pg — ABNORMAL LOW (ref 26.0–34.0)
MCHC: 31.1 g/dL (ref 30.0–36.0)
MCV: 81.8 fL (ref 78.0–100.0)
Platelets: 280 10*3/uL (ref 150–400)
RBC: 4.01 MIL/uL (ref 3.87–5.11)
RDW: 13.7 % (ref 11.5–15.5)
WBC: 17.2 10*3/uL — AB (ref 4.0–10.5)

## 2016-05-31 LAB — BASIC METABOLIC PANEL
ANION GAP: 7 (ref 5–15)
BUN: 11 mg/dL (ref 6–20)
CALCIUM: 9.1 mg/dL (ref 8.9–10.3)
CHLORIDE: 106 mmol/L (ref 101–111)
CO2: 25 mmol/L (ref 22–32)
CREATININE: 0.93 mg/dL (ref 0.44–1.00)
GFR calc non Af Amer: >60 mL/min (ref >60)
Glucose, Bld: 169 mg/dL — ABNORMAL HIGH (ref 65–99)
Potassium: 4.3 mmol/L (ref 3.5–5.1)
SODIUM: 138 mmol/L (ref 135–145)

## 2016-05-31 MED ORDER — METHOCARBAMOL 500 MG PO TABS: 500.0000 mg | ORAL_TABLET | Freq: Four times a day (QID) | ORAL | 0 refills | Status: DC | PRN

## 2016-05-31 MED ORDER — ASPIRIN 325 MG PO TBEC: 325.0000 mg | DELAYED_RELEASE_TABLET | Freq: Two times a day (BID) | ORAL | 0 refills | Status: DC

## 2016-05-31 MED ORDER — OXYCODONE HCL 10 MG PO TABS: 10.0000 mg | ORAL_TABLET | ORAL | 0 refills | Status: DC | PRN

## 2016-05-31 NOTE — Progress Notes (Signed)
Orthopedic Tech Progress Note Patient Details:  [name] [birth year] [id number]  Patient ID: [name], female   DOB: 1950/06/04, 66 y.o.   MRN: [id number]   [name] 05/31/2016, 2:04 PM Placed pt's rle on cpm @0 -90 degrees @1400 ; RN notified

## 2016-05-31 NOTE — Discharge Summary (Signed)
SPORTS MEDICINE & JOINT REPLACEMENT   Lara Mulch, MD   Carlyon Shadow, PA-C [place], [place], [place]  [address]                             [phone]  PATIENT ID: [name]        MRN:  [id number]          DOB/AGE: [birth year] / 66 y.o.    DISCHARGE SUMMARY  ADMISSION DATE:    05/30/2016 DISCHARGE DATE:   05/31/2016   ADMISSION DIAGNOSIS: LOOSENING TOTAL KNEE ARTHROPLASTY RIGHT KNEE    DISCHARGE DIAGNOSIS:  LOOSENING TOTAL KNEE ARTHROPLASTY RIGHT KNEE    ADDITIONAL DIAGNOSIS: Active Problems:   S/P revision of total knee  Past Medical History:  Diagnosis Date  . Anemia   . Anxiety   . Arthritis   . Asthma   . Depression   . GERD (gastroesophageal reflux disease)   . Heart murmur    Mitral Valve prolapse  . History of kidney stones   . Hypertension   . PONV (postoperative nausea and vomiting)     PROCEDURE: Procedure(s): TOTAL KNEE REVISION on 05/30/2016  CONSULTS:    HISTORY:  See H&P in chart  HOSPITAL COURSE:  [name] is a 66 y.o. admitted on 05/30/2016 and found to have a diagnosis of [place].  After appropriate laboratory studies were obtained  they were taken to the operating room on 05/30/2016 and underwent Procedure(s): [place].   They were given perioperative antibiotics:  Anti-infectives    Start     Dose/Rate Route Frequency Ordered Stop   05/30/16 1600  ceFAZolin (ANCEF) IVPB 1 g/50 mL premix     1 g 100 mL/hr over 30 Minutes Intravenous Every 6 hours 05/30/16 1428 05/30/16 2232   05/30/16 0600  ceFAZolin (ANCEF) IVPB 2g/100 mL premix     2 g 200 mL/hr over 30 Minutes Intravenous On call to O.R. 05/29/16 1622 05/30/16 1009    .  Patient given tranexamic acid IV or topical and exparel intra-operatively.  Tolerated the procedure well.    POD# 1: Vital signs were stable.  Patient denied Chest pain, shortness of breath, or calf pain.  Patient was started on Lovenox 30 mg  subcutaneously twice daily at 8am.  Consults to PT, OT, and care management were made.  The patient was weight bearing as tolerated.  CPM was placed on the operative leg 0-90 degrees for 6-8 hours a day. When out of the CPM, patient was placed in the foam block to achieve full extension. Incentive spirometry was taught.  Dressing was changed.       POD #2, Continued  PT for ambulation and exercise program.  IV saline locked.  O2 discontinued.    The remainder of the hospital course was dedicated to ambulation and strengthening.   The patient was discharged on 1 Day Post-Op in  Good condition.  Blood products given:none  DIAGNOSTIC STUDIES: Recent vital signs: Patient Vitals for the past 24 hrs:  BP Temp Temp src Pulse SpO2  05/31/16 0550 (!) 96/52 97.8 F (36.6 C) Oral (!) 55 96 %  05/31/16 0135 (!) 93/46 97.8 F (36.6 C) Oral 69 95 %  05/30/16 2202 (!) 104/56 97.4 F (36.3 C) Axillary (!) 58 96 %       Recent laboratory studies:  Recent Labs  05/31/16 0524  WBC 17.2*  HGB  10.2*  HCT 32.8*  PLT 280    Recent Labs  05/31/16 0524  NA 138  K 4.3  CL 106  CO2 25  BUN 11  CREATININE 0.93  GLUCOSE 169*  CALCIUM 9.1   Lab Results  Component Value Date   INR 0.9 10/28/2008     Recent Radiographic Studies :  No results found.  DISCHARGE INSTRUCTIONS: Discharge Instructions    CPM    Complete by:  As directed    Continuous passive motion machine (CPM):      Use the CPM from 0 to 90 for 4-6 hours per day.      You may increase by 10 per day.  You may break it up into 2 or 3 sessions per day.      Use CPM for 2 weeks or until you are told to stop.   Call MD / Call 911    Complete by:  As directed    If you experience chest pain or shortness of breath, CALL 911 and be transported to the hospital emergency room.  If you develope a fever above 101 F, pus (white drainage) or increased drainage or redness at the wound, or calf pain, call your surgeon's office.    Constipation Prevention    Complete by:  As directed    Drink plenty of fluids.  Prune juice may be helpful.  You may use a stool softener, such as Colace (over the counter) 100 mg twice a day.  Use MiraLax (over the counter) for constipation as needed.   Diet - low sodium heart healthy    Complete by:  As directed    Discharge instructions    Complete by:  As directed    INSTRUCTIONS AFTER JOINT REPLACEMENT   Remove items at home which could result in a fall. This includes throw rugs or furniture in walking pathways ICE to the affected joint every three hours while awake for 30 minutes at a time, for at least the first 3-5 days, and then as needed for pain and swelling.  Continue to use ice for pain and swelling. You may notice swelling that will progress down to the foot and ankle.  This is normal after surgery.  Elevate your leg when you are not up walking on it.   Continue to use the breathing machine you got in the hospital (incentive spirometer) which will help keep your temperature down.  It is common for your temperature to cycle up and down following surgery, especially at night when you are not up moving around and exerting yourself.  The breathing machine keeps your lungs expanded and your temperature down.   DIET:  As you were doing prior to hospitalization, we recommend a well-balanced diet.  DRESSING / WOUND CARE / SHOWERING  Keep the surgical dressing until follow up.  The dressing is water proof, so you can shower without any extra covering.  IF THE DRESSING FALLS OFF or the wound gets wet inside, change the dressing with sterile gauze.  Please use good hand washing techniques before changing the dressing.  Do not use any lotions or creams on the incision until instructed by your surgeon.    ACTIVITY  Increase activity slowly as tolerated, but follow the weight bearing instructions below.   No driving for 6 weeks or until further direction given by your physician.  You cannot  drive while taking narcotics.  No lifting or carrying greater than 10 lbs. until further directed by your surgeon.  Avoid periods of inactivity such as sitting longer than an hour when not asleep. This helps prevent blood clots.  You may return to work once you are authorized by your doctor.     WEIGHT BEARING   Weight bearing as tolerated with assist device (walker, cane, etc) as directed, use it as long as suggested by your surgeon or therapist, typically at least 4-6 weeks.   EXERCISES  Results after joint replacement surgery are often greatly improved when you follow the exercise, range of motion and muscle strengthening exercises prescribed by your doctor. Safety measures are also important to protect the joint from further injury. Any time any of these exercises cause you to have increased pain or swelling, decrease what you are doing until you are comfortable again and then slowly increase them. If you have problems or questions, call your caregiver or physical therapist for advice.   Rehabilitation is important following a joint replacement. After just a few days of immobilization, the muscles of the leg can become weakened and shrink (atrophy).  These exercises are designed to build up the tone and strength of the thigh and leg muscles and to improve motion. Often times heat used for twenty to thirty minutes before working out will loosen up your tissues and help with improving the range of motion but do not use heat for the first two weeks following surgery (sometimes heat can increase post-operative swelling).   These exercises can be done on a training (exercise) mat, on the floor, on a table or on a bed. Use whatever works the best and is most comfortable for you.    Use music or television while you are exercising so that the exercises are a pleasant break in your day. This will make your life better with the exercises acting as a break in your routine that you can look forward to.    Perform all exercises about fifteen times, three times per day or as directed.  You should exercise both the operative leg and the other leg as well.   Exercises include:   Quad Sets - Tighten up the muscle on the front of the thigh (Quad) and hold for 5-10 seconds.   Straight Leg Raises - With your knee straight (if you were given a brace, keep it on), lift the leg to 60 degrees, hold for 3 seconds, and slowly lower the leg.  Perform this exercise against resistance later as your leg gets stronger.  Leg Slides: Lying on your back, slowly slide your foot toward your buttocks, bending your knee up off the floor (only go as far as is comfortable). Then slowly slide your foot back down until your leg is flat on the floor again.  Angel Wings: Lying on your back spread your legs to the side as far apart as you can without causing discomfort.  Hamstring Strength:  Lying on your back, push your heel against the floor with your leg straight by tightening up the muscles of your buttocks.  Repeat, but this time bend your knee to a comfortable angle, and push your heel against the floor.  You may put a pillow under the heel to make it more comfortable if necessary.   A rehabilitation program following joint replacement surgery can speed recovery and prevent re-injury in the future due to weakened muscles. Contact your doctor or a physical therapist for more information on knee rehabilitation.    CONSTIPATION  Constipation is defined medically as fewer than three stools per week  and severe constipation as less than one stool per week.  Even if you have a regular bowel pattern at home, your normal regimen is likely to be disrupted due to multiple reasons following surgery.  Combination of anesthesia, postoperative narcotics, change in appetite and fluid intake all can affect your bowels.   YOU MUST use at least one of the following options; they are listed in order of increasing strength to get the job done.   They are all available over the counter, and you may need to use some, POSSIBLY even all of these options:    Drink plenty of fluids (prune juice may be helpful) and high fiber foods Colace 100 mg by mouth twice a day  Senokot for constipation as directed and as needed Dulcolax (bisacodyl), take with full glass of water  Miralax (polyethylene glycol) once or twice a day as needed.  If you have tried all these things and are unable to have a bowel movement in the first 3-4 days after surgery call either your surgeon or your primary doctor.    If you experience loose stools or diarrhea, hold the medications until you stool forms back up.  If your symptoms do not get better within 1 week or if they get worse, check with your doctor.  If you experience "the worst abdominal pain ever" or develop nausea or vomiting, please contact the office immediately for further recommendations for treatment.   ITCHING:  If you experience itching with your medications, try taking only a single pain pill, or even half a pain pill at a time.  You can also use Benadryl over the counter for itching or also to help with sleep.   TED HOSE STOCKINGS:  Use stockings on both legs until for at least 2 weeks or as directed by physician office. They may be removed at night for sleeping.  MEDICATIONS:  See your medication summary on the "After Visit Summary" that nursing will review with you.  You may have some home medications which will be placed on hold until you complete the course of blood thinner medication.  It is important for you to complete the blood thinner medication as prescribed.  PRECAUTIONS:  If you experience chest pain or shortness of breath - call 911 immediately for transfer to the hospital emergency department.   If you develop a fever greater that 101 F, purulent drainage from wound, increased redness or drainage from wound, foul odor from the wound/dressing, or calf pain - CONTACT YOUR SURGEON.                                                    FOLLOW-UP APPOINTMENTS:  If you do not already have a post-op appointment, please call the office for an appointment to be seen by your surgeon.  Guidelines for how soon to be seen are listed in your "After Visit Summary", but are typically between 1-4 weeks after surgery.  OTHER INSTRUCTIONS:   Knee Replacement:  Do not place pillow under knee, focus on keeping the knee straight while resting. CPM instructions: 0-90 degrees, 2 hours in the morning, 2 hours in the afternoon, and 2 hours in the evening. Place foam block, curve side up under heel at all times except when in CPM or when walking.  DO NOT modify, tear, cut, or change the foam block  in any way.  MAKE SURE YOU:  Understand these instructions.  Get help right away if you are not doing well or get worse.    Thank you for letting us be a part of your medical care team.  It is a privilege we respect greatly.  We hope these instructions will help you stay on track for a fast and full recovery!   Increase activity slowly as tolerated    Complete by:  As directed       DISCHARGE MEDICATIONS:   Allergies as of 05/31/2016      Reactions   No Known Allergies       Medication List    STOP taking these medications   VOLTAREN 1 % Gel Generic drug:  diclofenac sodium     TAKE these medications   albuterol 108 (90 Base) MCG/ACT inhaler Commonly known as:  PROVENTIL HFA;VENTOLIN HFA Inhale 2 puffs into the lungs every 6 (six) hours as needed for wheezing or shortness of breath.   albuterol (2.5 MG/3ML) 0.083% nebulizer solution Commonly known as:  PROVENTIL Take 2.5 mg by nebulization every 6 (six) hours as needed (for wheezing/shortness of breath).   amLODipine-benazepril 5-40 MG capsule Commonly known as:  LOTREL Take 1 capsule by mouth every evening.   aspirin 325 MG EC tablet Take 1 tablet (325 mg total) by mouth 2 (two) times daily. What changed:  medication strength  how much to  take  when to take this  additional instructions   beclomethasone 80 MCG/ACT inhaler Commonly known as:  QVAR Inhale 2 puffs into the lungs daily as needed (for respiratory issues.).   busPIRone 30 MG tablet Commonly known as:  BUSPAR Take 30 mg by mouth daily as needed (for anxiety.).   BUTRANS 10 MCG/HR Ptwk patch Generic drug:  buprenorphine Place 10 mcg onto the skin 7 days.   CALCIUM 500 PO Take 500 mg by mouth 3 (three) times daily.   celecoxib 200 MG capsule Commonly known as:  CELEBREX Take 200 mg by mouth 2 (two) times daily as needed (for pain/inflammation).   denosumab 60 MG/ML Soln injection Commonly known as:  PROLIA Inject 60 mg into the skin every 6 (six) months.   EPINEPHrine 0.3 mg/0.3 mL Soaj injection Commonly known as:  EPI-PEN Inject 0.3 mg into the muscle daily. For anaphylactic allergic reaction.   fexofenadine 180 MG tablet Commonly known as:  ALLEGRA TAKE 1 TABLET DAILY   Fish Oil 1200 MG Caps Take 2,400 mg by mouth daily at 6 PM.   FLUoxetine 40 MG capsule Commonly known as:  PROZAC Take 40 mg by mouth daily.   fluticasone 50 MCG/ACT nasal spray Commonly known as:  FLONASE Place 1 spray into both nostrils as needed for allergies or rhinitis.   methocarbamol 500 MG tablet Commonly known as:  ROBAXIN Take 1-2 tablets (500-1,000 mg total) by mouth every 6 (six) hours as needed for muscle spasms.   montelukast 10 MG tablet Commonly known as:  SINGULAIR Take 10 mg by mouth daily at 6 PM.   multivitamin with minerals Tabs tablet Take 1 tablet by mouth daily. 1700 (2 gummies)   OTEZLA 30 MG Tabs Generic drug:  Apremilast Take 30 mg by mouth daily.   Oxycodone HCl 10 MG Tabs Take 1 tablet (10 mg total) by mouth every 3 (three) hours as needed for breakthrough pain.   pravastatin 40 MG tablet Commonly known as:  PRAVACHOL Take 40 mg by mouth at bedtime.   ranitidine  150 MG tablet Commonly known as:  ZANTAC Take 150 mg by mouth 2  (two) times daily.   SYMBICORT 160-4.5 MCG/ACT inhaler Generic drug:  budesonide-formoterol Inhale 2 puffs into the lungs 2 (two) times daily as needed. For respiratory issue   traMADol 50 MG tablet Commonly known as:  ULTRAM Take 50 mg by mouth 3 (three) times daily as needed for pain.            Durable Medical Equipment        Start     Ordered   05/30/16 1429  DME Walker rolling  Once    Question:  Patient needs a walker to treat with the following condition  Answer:  S/P revision of total knee   05/30/16 1428   05/30/16 1429  DME 3 n 1  Once     05/30/16 1428   05/30/16 1429  DME Bedside commode  Once    Question:  Patient needs a bedside commode to treat with the following condition  Answer:  S/P revision of total knee   05/30/16 1428      FOLLOW UP VISIT:    DISPOSITION: HOME VS. SNF  CONDITION:  Good   Donia Ast 05/31/2016, 4:09 PM

## 2016-05-31 NOTE — Progress Notes (Signed)
SPORTS MEDICINE AND JOINT REPLACEMENT  Lara Mulch, MD    Carlyon Shadow, PA-C [place], [place], [place]  [address]                             [phone]   PROGRESS NOTE  Subjective:  negative for Chest Pain  negative for Shortness of Breath  negative for Nausea/Vomiting   negative for Calf Pain  negative for Bowel Movement   Tolerating Diet: yes         Patient reports pain as 4 on 0-10 scale.    Objective: Vital signs in last 24 hours:   Patient Vitals for the past 24 hrs:  BP Temp Temp src Pulse Resp SpO2 Height Weight  05/31/16 0550 (!) 96/52 97.8 F (36.6 C) Oral (!) 55 - 96 % - -  05/31/16 0135 (!) 93/46 97.8 F (36.6 C) Oral 69 - 95 % - -  05/30/16 2202 (!) 104/56 97.4 F (36.3 C) Axillary (!) 58 - 96 % - -  05/30/16 1437 110/63 97.6 F (36.4 C) - 65 - 98 % - -  05/30/16 1410 101/63 - - (!) 54 16 97 % - -  05/30/16 1355 102/62 97.9 F (36.6 C) - 64 16 97 % - -  05/30/16 1340 94/64 - - 67 16 99 % - -  05/30/16 1325 (!) 105/57 - - 71 16 96 % - -  05/30/16 1310 98/61 - - 69 19 98 % - -  05/30/16 1255 115/65 97.5 F (36.4 C) - 72 17 97 % - -  05/30/16 1696 - - - - - - 5\' 3"  (1.6 m) 72.1 kg (159 lb)    @flow {1959:LAST@   Intake/Output from previous day:   03/26 0701 - 03/27 0700 In: 2012.5 [P.O.:60; I.V.:1902.5] Out: 700 [Urine:600]   Intake/Output this shift:   No intake/output data recorded.   Intake/Output      03/26 0701 - 03/27 0700 03/27 0701 - 03/28 0700   P.O. 60    I.V. (mL/kg) 1902.5 (26.4)    IV Piggyback 50    Total Intake(mL/kg) 2012.5 (27.9)    Urine (mL/kg/hr) 600 (0.3)    Blood 100 (0.1)    Total Output 700     Net +1312.5          Urine Occurrence 2 x       LABORATORY DATA:  Recent Labs  05/31/16 0524  WBC 17.2*  HGB 10.2*  HCT 32.8*  PLT 280    Recent Labs  05/31/16 0524  NA 138  K 4.3  CL 106  CO2 25  BUN 11  CREATININE 0.93  GLUCOSE 169*  CALCIUM 9.1   Lab Results  Component Value Date   INR 0.9 10/28/2008    Examination:  General appearance: alert, cooperative and no distress Extremities: extremities normal, atraumatic, no cyanosis or edema  Wound Exam: clean, dry, intact   Drainage:  None: wound tissue dry  Motor Exam: Quadriceps and Hamstrings Intact  Sensory Exam: Superficial Peroneal, Deep Peroneal and Tibial normal   Assessment:    1 Day Post-Op  Procedure(s) (LRB): TOTAL KNEE REVISION (Right)  ADDITIONAL DIAGNOSIS:  Active Problems:   S/P revision of total knee     Plan: Physical Therapy as ordered Weight Bearing as Tolerated (WBAT)  DVT Prophylaxis:  Aspirin  DISCHARGE PLAN: Home  DISCHARGE NEEDS: HHPT   Patient doing well, will determine D/C home based  on therapy         Donia Ast 05/31/2016, 7:12 AM

## 2016-05-31 NOTE — Evaluation (Signed)
Occupational Therapy Evaluation Patient Details Name: [name] MRN: [id number] DOB: Dec 21, 1950 Today's Date: 05/31/2016    History of Present Illness Admitted for RTKA, WBAT;  has a pertinent past medical history of Anemia; Anxiety; Arthritis; Asthma; Depression; Hypertension; and PONV (postoperative nausea and vomiting).  has a pertinent past surgical history that includes  Joint replacement; Wrist Arthroplasty (Left)   Clinical Impression   PTA, pt lived with her husband and was independent in ADLs and IADLs. Currently, pt performed dressing, bathing, and grooming independently. Educated pt on LB dressing techniques, fall prevention, tub transfers, and donning/doffing R knee immobilizer. Pt verbalized and demonstrated understanding. Recommend dc home with initial 24 hour supervision once medically stable per physician. Answered pt questions and provided appropriate education; met pt's OT needs. Will sign off acute OT.     Follow Up Recommendations  No OT follow up;Supervision/Assistance - 24 hour    Equipment Recommendations  None recommended by OT    Recommendations for Other Services       Precautions / Restrictions Precautions Precautions: Knee Precaution Comments: Educated on straight knee Required Braces or Orthoses: Knee Immobilizer - Right Knee Immobilizer - Right: Other (comment) (Educated pt on donning/doffing KI) Restrictions Weight Bearing Restrictions: Yes RLE Weight Bearing: Weight bearing as tolerated      Mobility Bed Mobility               General bed mobility comments: In recliner upon arrival  Transfers                      Balance                                           ADL either performed or assessed with clinical judgement   ADL Overall ADL's : Modified independent                                       General ADL Comments: Pt dressed upon arrival. Pt reports that she performed groomign at  sink, sponge bath, and dressing independently. Educated pt on LB dressing techniques and pt verbalized understanding.      Vision         Perception     Praxis      Pertinent Vitals/Pain Pain Assessment: Faces Faces Pain Scale: No hurt Pain Location: R knee Pain Descriptors / Indicators: Aching Pain Intervention(s): Monitored during session     Hand Dominance Right   Extremity/Trunk Assessment Upper Extremity Assessment Upper Extremity Assessment: Overall WFL for tasks assessed   Lower Extremity Assessment Lower Extremity Assessment: Defer to PT evaluation RLE Deficits / Details: Grossly decr AROM and strength R knee postop; notable quad lag with straight leg raise       Communication Communication Communication: No difficulties   Cognition Arousal/Alertness: Awake/alert Behavior During Therapy: WFL for tasks assessed/performed Overall Cognitive Status: Within Functional Limits for tasks assessed                                     General Comments  Educated pt on fall risk prevention, RW management during ADLs, and tub transfer techniques.    Exercises     Shoulder Instructions  Home Living Family/patient expects to be discharged to:: Private residence Living Arrangements: Spouse/significant other Available Help at Discharge: Family;Available 24 hours/day Type of Home: House Home Access: Ramped entrance     Home Layout: One level     Bathroom Shower/Tub: Teacher, early years/pre: Standard Bathroom Accessibility: Yes How Accessible: Accessible via wheelchair Home Equipment: [place] - 2 wheels;Bedside commode;Tub bench;Grab bars - tub/shower          Prior Functioning/Environment Level of Independence: Independent                 OT Problem List: Decreased range of motion;Decreased activity tolerance;Pain;Decreased knowledge of use of DME or AE;Decreased knowledge of precautions      OT Treatment/Interventions:       OT Goals(Current goals can be found in the care plan section) Acute Rehab OT Goals Patient Stated Goal: back to church OT Goal Formulation: With patient Time For Goal Achievement: 06/14/16 Potential to Achieve Goals: Good  OT Frequency:     Barriers to D/C:            Co-evaluation              End of Session Equipment Utilized During Treatment: Right knee immobilizer Nurse Communication: Mobility status  Activity Tolerance: Patient tolerated treatment well Patient left: in chair;with call bell/phone within reach;with family/visitor present  OT Visit Diagnosis: Unsteadiness on feet (R26.81);Pain Pain - Right/Left: Right Pain - part of body: Knee                Time: 1112-1126 OT Time Calculation (min): 14 min Charges:  OT General Charges $OT Visit: 1 Procedure OT Evaluation $OT Eval Low Complexity: 1 Procedure G-Codes: OT G-codes **NOT FOR INPATIENT CLASS** Functional Assessment Tool Used: Clinical judgement Functional Limitation: Self care Self Care Current Status (M6004): At least 1 percent but less than 20 percent impaired, limited or restricted Self Care Goal Status (H9977): At least 1 percent but less than 20 percent impaired, limited or restricted Self Care Discharge Status [phone]): At least 1 percent but less than 20 percent impaired, limited or restricted   [facility], OTR/L [place] 05/31/2016, 11:33 AM

## 2016-05-31 NOTE — Op Note (Signed)
Dictation Number:  [id number]

## 2016-05-31 NOTE — Care Management Note (Signed)
Case Management Note  Patient Details  Name: [name] MRN: [id number] Date of Birth: 1950/11/15  Subjective/Objective:   66 yr old female s/p right total knee revision.                  Action/Plan: Patient was preoperatively setup with [facility], no changes. Has DME. Will have family support at discharge.   Expected Discharge Date:  05/31/16               Expected Discharge Plan:  [name]  In-House Referral:  NA  Discharge planning Services  CM Consult  Post Acute Care Choice:  Home Health Choice offered to:  Patient  DME Arranged:  CPM DME Agency:  [employer]  HH Arranged:  PT HH Agency:  [facility] (formerly [employer])  Status of Service:  Completed, signed off  If discussed at [employer], dates discussed:    Additional Comments:  Ninfa Meeker, RN 05/31/2016, 4:42 PM

## 2016-05-31 NOTE — Progress Notes (Signed)
qPhysical Therapy Treatment Patient Details Name: [name] MRN: [id number] DOB: 11-30-1950 Today's Date: 05/31/2016    History of Present Illness Admitted for RTKA, WBAT;  has a pertinent past medical history of Anemia; Anxiety; Arthritis; Asthma; Depression; Hypertension; and PONV (postoperative nausea and vomiting).  has a pertinent past surgical history that includes  Joint replacement; Wrist Arthroplasty (Left)    PT Comments    Able to make excellent progress with amb distance and activity tolerance today; Questions about managing at home solicited and answered; OK for dc home from PT standpoint    Follow Up Recommendations  Home health PT;Supervision/Assistance - 24 hour     Equipment Recommendations  None recommended by PT    Recommendations for Other Services       Precautions / Restrictions Precautions Precautions: Knee Precaution Booklet Issued: Yes (comment) Precaution Comments: Educated on straight knee Required Braces or Orthoses: Knee Immobilizer - Right Knee Immobilizer - Right: Other (comment) (Educated pt on donning/doffing KI) Restrictions Weight Bearing Restrictions: Yes RLE Weight Bearing: Weight bearing as tolerated  Pt educated to not allow any pillow or bolster under knee for healing with optimal range of motion.    Mobility  Bed Mobility               General bed mobility comments: In recliner upon arrival  Transfers Overall transfer level: Needs assistance Equipment used: Rolling walker (2 wheeled) Transfers: Sit to/from Stand Sit to Stand: Min guard         General transfer comment: Cues for hand placement and safety  Ambulation/Gait Ambulation/Gait assistance: Min guard Ambulation Distance (Feet): 110 Feet Assistive device: Rolling walker (2 wheeled) Gait Pattern/deviations: Step-through pattern Gait velocity: slowed Gait velocity interpretation: Below normal speed for age/gender General Gait Details: Cues to self-monitor  for activity tolerance; Nice, stable knee in stance   Stairs            Wheelchair Mobility    Modified Rankin (Stroke Patients Only)       Balance                                            Cognition Arousal/Alertness: Awake/alert Behavior During Therapy: WFL for tasks assessed/performed Overall Cognitive Status: Within Functional Limits for tasks assessed                                        Exercises Total Joint Exercises Quad Sets: AROM;Right;5 reps Straight Leg Raises: AROM;Right;10 reps (minimal quad lag) Long Arc Quad: AROM;Right;10 reps Goniometric ROM: 0-90 deg, approx    General Comments General comments (skin integrity, edema, etc.): We discussed HEP, car transfers, pt and husband verbalized understanding      Pertinent Vitals/Pain Pain Assessment: Faces Pain Score: 7  Faces Pain Scale: Hurts a little bit Pain Location: R knee Pain Descriptors / Indicators: Aching Pain Intervention(s): Monitored during session    Home Living Family/patient expects to be discharged to:: Private residence Living Arrangements: Spouse/significant other Available Help at Discharge: Family;Available 24 hours/day Type of Home: House Home Access: Ramped entrance   Home Layout: One level Home Equipment: Walker - 2 wheels;Bedside commode;Tub bench;Grab bars - tub/shower      Prior Function Level of Independence: Independent          PT Goals (  current goals can now be found in the care plan section) Acute Rehab PT Goals Patient Stated Goal: back to church PT Goal Formulation: With patient Time For Goal Achievement: 06/06/16 Potential to Achieve Goals: Good Progress towards PT goals: Progressing toward goals    Frequency    7X/week      PT Plan Current plan remains appropriate    Co-evaluation             End of Session Equipment Utilized During Treatment: Gait belt;Right knee immobilizer Activity Tolerance:  Patient tolerated treatment well Patient left: in chair;with call bell/phone within reach;with family/visitor present Nurse Communication: Mobility status PT Visit Diagnosis: Other abnormalities of gait and mobility (R26.89);Pain Pain - Right/Left: Right Pain - part of body: Knee     Time: 0340-3524 PT Time Calculation (min) (ACUTE ONLY): 23 min  Charges:  $Gait Training: 23-37 mins                    G Codes:       Roney Marion, [place] Pager [phone] Office [place] 05/31/2016, 1:51 PM

## 2016-06-01 DIAGNOSIS — Z96651 Presence of right artificial knee joint: Secondary | ICD-10-CM | POA: Diagnosis not present

## 2016-06-01 DIAGNOSIS — I10 Essential (primary) hypertension: Secondary | ICD-10-CM | POA: Diagnosis not present

## 2016-06-01 DIAGNOSIS — J45909 Unspecified asthma, uncomplicated: Secondary | ICD-10-CM | POA: Diagnosis not present

## 2016-06-01 DIAGNOSIS — F329 Major depressive disorder, single episode, unspecified: Secondary | ICD-10-CM | POA: Diagnosis not present

## 2016-06-01 DIAGNOSIS — T84092D Other mechanical complication of internal right knee prosthesis, subsequent encounter: Secondary | ICD-10-CM | POA: Diagnosis not present

## 2016-06-01 DIAGNOSIS — M1990 Unspecified osteoarthritis, unspecified site: Secondary | ICD-10-CM | POA: Diagnosis not present

## 2016-06-01 NOTE — Op Note (Signed)
[name], [name]               ACCOUNT NO.:  [id number]  MEDICAL RECORD NO.:  [id number]  LOCATION:                                 FACILITY:  PHYSICIAN:  Estill Bamberg. Ronnie Derby, M.D.      DATE OF BIRTH:  DATE OF PROCEDURE:  05/30/2016 DATE OF DISCHARGE:                              OPERATIVE REPORT   SURGEON:  Estill Bamberg. Ronnie Derby, M.D.  ASSISTANT:  Carlyon Shadow, PA-C.  ANESTHESIA:  General anesthesia after attempted spinal anesthesia.  PREOPERATIVE DIAGNOSIS:  Failed right total knee arthroplasty.  POSTOPERATIVE DIAGNOSIS:  Failed right total knee arthroplasty.  PROCEDURE:  Right revision total knee arthroplasty (Femur and tibia).  INDICATION FOR PROCEDURE:  The patient is a 66 year old, white female, with a loose right TKA on x-ray.  An informed consent was obtained for a complete revision.  DESCRIPTION OF PROCEDURE:  The patient was laid supine, administered general anesthesia.  The right knee prepped and draped in the usual fashion.  The extremity was exsanguinated with the Esmarch.  Tourniquet inflated to 350 mmHg.  A #10 blade was used to make a midline incision over the old incision and extended up, where the entire length of the incision was 10 cm.  We used a new #10 blade to perform a median parapatellar arthrotomy and perform a synovectomy.  There was lot of scar tissue in the knee.  I flexed the knee up, and there was loosening both on the medial tibia and the anterior flange of the femur.  I elected to revise the entire thing.  We used the micro re-saw on both the femur and the tibia as well as revision osteotomes, and removed both easily.  There was minimal bone loss.  I reamed both canals, the femur to a 12, the tibia to a 10.  I then recut the tibia and used the size D finishing block, drilling for the log and tamping for the keel.  I turned my attention to recreating the femoral component, which I did by using a D femur, a CCK cutting block and 2 distal 5 mm  augments.  There was a very very soft bone on the lateral femoral condyle with a nondisplaced fracture line into the condyle.  I gave good care to not have any issues there postoperatively.  I then trialed with a size D femur with 2 distal 5 mm augments and a 12 stem with a size D tibia base plate, with a size 10 stem.  I then snapped in a 10 mm CCK poly insert, and had good flexion-extension gap balance, good range of motion and good stability.  I then removed those trial components and copiously irrigated.  I cemented in both components removing all excess cement and allowing the cement to harden in extension.  I then placed the Exparel and Marcaine mixture into the knee.  I then let the tourniquet down and the cement was hardened, this was at 92 minutes.  I then obtained hemostasis and irrigated again.  I closed the arthrotomy with figure-of- eight #1 Vicryl sutures, buried 0 Vicryl sutures, subcuticular Monocryl and dressed with an Aquacel and an Ace wrap.  COMPLICATIONS:  None.  DRAINS:  None.  EBL:  100 mL.  TOURNIQUET TIME:  90 minutes.    ______________________________ Estill Bamberg Ronnie Derby, M.D.   ______________________________ Estill Bamberg. Ronnie Derby, M.D.    SDL/MEDQ  D:  05/31/2016  T:  05/31/2016  Job:  [id number]

## 2016-06-02 DIAGNOSIS — F329 Major depressive disorder, single episode, unspecified: Secondary | ICD-10-CM | POA: Diagnosis not present

## 2016-06-02 DIAGNOSIS — Z96651 Presence of right artificial knee joint: Secondary | ICD-10-CM | POA: Diagnosis not present

## 2016-06-02 DIAGNOSIS — J45909 Unspecified asthma, uncomplicated: Secondary | ICD-10-CM | POA: Diagnosis not present

## 2016-06-02 DIAGNOSIS — I10 Essential (primary) hypertension: Secondary | ICD-10-CM | POA: Diagnosis not present

## 2016-06-02 DIAGNOSIS — T84092D Other mechanical complication of internal right knee prosthesis, subsequent encounter: Secondary | ICD-10-CM | POA: Diagnosis not present

## 2016-06-02 DIAGNOSIS — M1990 Unspecified osteoarthritis, unspecified site: Secondary | ICD-10-CM | POA: Diagnosis not present

## 2016-06-04 DIAGNOSIS — T84092D Other mechanical complication of internal right knee prosthesis, subsequent encounter: Secondary | ICD-10-CM | POA: Diagnosis not present

## 2016-06-04 DIAGNOSIS — I10 Essential (primary) hypertension: Secondary | ICD-10-CM | POA: Diagnosis not present

## 2016-06-04 DIAGNOSIS — J45909 Unspecified asthma, uncomplicated: Secondary | ICD-10-CM | POA: Diagnosis not present

## 2016-06-04 DIAGNOSIS — F329 Major depressive disorder, single episode, unspecified: Secondary | ICD-10-CM | POA: Diagnosis not present

## 2016-06-04 DIAGNOSIS — M1990 Unspecified osteoarthritis, unspecified site: Secondary | ICD-10-CM | POA: Diagnosis not present

## 2016-06-04 DIAGNOSIS — Z96651 Presence of right artificial knee joint: Secondary | ICD-10-CM | POA: Diagnosis not present

## 2016-06-06 DIAGNOSIS — T84092D Other mechanical complication of internal right knee prosthesis, subsequent encounter: Secondary | ICD-10-CM | POA: Diagnosis not present

## 2016-06-06 DIAGNOSIS — F329 Major depressive disorder, single episode, unspecified: Secondary | ICD-10-CM | POA: Diagnosis not present

## 2016-06-06 DIAGNOSIS — I10 Essential (primary) hypertension: Secondary | ICD-10-CM | POA: Diagnosis not present

## 2016-06-06 DIAGNOSIS — M1990 Unspecified osteoarthritis, unspecified site: Secondary | ICD-10-CM | POA: Diagnosis not present

## 2016-06-06 DIAGNOSIS — J45909 Unspecified asthma, uncomplicated: Secondary | ICD-10-CM | POA: Diagnosis not present

## 2016-06-06 DIAGNOSIS — Z96651 Presence of right artificial knee joint: Secondary | ICD-10-CM | POA: Diagnosis not present

## 2016-06-09 DIAGNOSIS — Z471 Aftercare following joint replacement surgery: Secondary | ICD-10-CM | POA: Diagnosis not present

## 2016-06-09 DIAGNOSIS — I10 Essential (primary) hypertension: Secondary | ICD-10-CM | POA: Diagnosis not present

## 2016-06-09 DIAGNOSIS — Z96651 Presence of right artificial knee joint: Secondary | ICD-10-CM | POA: Diagnosis not present

## 2016-06-09 DIAGNOSIS — F329 Major depressive disorder, single episode, unspecified: Secondary | ICD-10-CM | POA: Diagnosis not present

## 2016-06-09 DIAGNOSIS — R936 Abnormal findings on diagnostic imaging of limbs: Secondary | ICD-10-CM | POA: Diagnosis not present

## 2016-06-09 DIAGNOSIS — T84092D Other mechanical complication of internal right knee prosthesis, subsequent encounter: Secondary | ICD-10-CM | POA: Diagnosis not present

## 2016-06-09 DIAGNOSIS — M1990 Unspecified osteoarthritis, unspecified site: Secondary | ICD-10-CM | POA: Diagnosis not present

## 2016-06-09 DIAGNOSIS — J45909 Unspecified asthma, uncomplicated: Secondary | ICD-10-CM | POA: Diagnosis not present

## 2016-06-10 DIAGNOSIS — M25561 Pain in right knee: Secondary | ICD-10-CM | POA: Diagnosis not present

## 2016-06-10 DIAGNOSIS — M25661 Stiffness of right knee, not elsewhere classified: Secondary | ICD-10-CM | POA: Diagnosis not present

## 2016-06-10 DIAGNOSIS — R262 Difficulty in walking, not elsewhere classified: Secondary | ICD-10-CM | POA: Diagnosis not present

## 2016-06-24 DIAGNOSIS — R262 Difficulty in walking, not elsewhere classified: Secondary | ICD-10-CM | POA: Diagnosis not present

## 2016-06-24 DIAGNOSIS — M25561 Pain in right knee: Secondary | ICD-10-CM | POA: Diagnosis not present

## 2016-06-24 DIAGNOSIS — M25661 Stiffness of right knee, not elsewhere classified: Secondary | ICD-10-CM | POA: Diagnosis not present

## 2016-06-28 DIAGNOSIS — M25661 Stiffness of right knee, not elsewhere classified: Secondary | ICD-10-CM | POA: Diagnosis not present

## 2016-06-28 DIAGNOSIS — R262 Difficulty in walking, not elsewhere classified: Secondary | ICD-10-CM | POA: Diagnosis not present

## 2016-06-28 DIAGNOSIS — M25561 Pain in right knee: Secondary | ICD-10-CM | POA: Diagnosis not present

## 2016-06-29 DIAGNOSIS — F332 Major depressive disorder, recurrent severe without psychotic features: Secondary | ICD-10-CM | POA: Diagnosis not present

## 2016-07-01 DIAGNOSIS — R262 Difficulty in walking, not elsewhere classified: Secondary | ICD-10-CM | POA: Diagnosis not present

## 2016-07-01 DIAGNOSIS — M25561 Pain in right knee: Secondary | ICD-10-CM | POA: Diagnosis not present

## 2016-07-01 DIAGNOSIS — M25661 Stiffness of right knee, not elsewhere classified: Secondary | ICD-10-CM | POA: Diagnosis not present

## 2016-07-05 DIAGNOSIS — M25561 Pain in right knee: Secondary | ICD-10-CM | POA: Diagnosis not present

## 2016-07-05 DIAGNOSIS — M25661 Stiffness of right knee, not elsewhere classified: Secondary | ICD-10-CM | POA: Diagnosis not present

## 2016-07-05 DIAGNOSIS — R262 Difficulty in walking, not elsewhere classified: Secondary | ICD-10-CM | POA: Diagnosis not present

## 2016-07-06 DIAGNOSIS — K219 Gastro-esophageal reflux disease without esophagitis: Secondary | ICD-10-CM | POA: Diagnosis not present

## 2016-07-06 DIAGNOSIS — E119 Type 2 diabetes mellitus without complications: Secondary | ICD-10-CM | POA: Diagnosis not present

## 2016-07-06 DIAGNOSIS — I10 Essential (primary) hypertension: Secondary | ICD-10-CM | POA: Diagnosis not present

## 2016-07-06 DIAGNOSIS — E782 Mixed hyperlipidemia: Secondary | ICD-10-CM | POA: Diagnosis not present

## 2016-07-06 DIAGNOSIS — Z6827 Body mass index (BMI) 27.0-27.9, adult: Secondary | ICD-10-CM | POA: Diagnosis not present

## 2016-07-06 DIAGNOSIS — J309 Allergic rhinitis, unspecified: Secondary | ICD-10-CM | POA: Diagnosis not present

## 2016-07-06 DIAGNOSIS — M8580 Other specified disorders of bone density and structure, unspecified site: Secondary | ICD-10-CM | POA: Diagnosis not present

## 2016-07-08 DIAGNOSIS — R262 Difficulty in walking, not elsewhere classified: Secondary | ICD-10-CM | POA: Diagnosis not present

## 2016-07-08 DIAGNOSIS — M25561 Pain in right knee: Secondary | ICD-10-CM | POA: Diagnosis not present

## 2016-07-08 DIAGNOSIS — M25661 Stiffness of right knee, not elsewhere classified: Secondary | ICD-10-CM | POA: Diagnosis not present

## 2016-07-11 DIAGNOSIS — M25561 Pain in right knee: Secondary | ICD-10-CM | POA: Diagnosis not present

## 2016-07-11 DIAGNOSIS — M25661 Stiffness of right knee, not elsewhere classified: Secondary | ICD-10-CM | POA: Diagnosis not present

## 2016-07-11 DIAGNOSIS — R262 Difficulty in walking, not elsewhere classified: Secondary | ICD-10-CM | POA: Diagnosis not present

## 2016-07-14 DIAGNOSIS — M25561 Pain in right knee: Secondary | ICD-10-CM | POA: Diagnosis not present

## 2016-07-14 DIAGNOSIS — M25661 Stiffness of right knee, not elsewhere classified: Secondary | ICD-10-CM | POA: Diagnosis not present

## 2016-07-14 DIAGNOSIS — R262 Difficulty in walking, not elsewhere classified: Secondary | ICD-10-CM | POA: Diagnosis not present

## 2016-07-16 DIAGNOSIS — L400 Psoriasis vulgaris: Secondary | ICD-10-CM | POA: Diagnosis not present

## 2016-07-18 DIAGNOSIS — M25561 Pain in right knee: Secondary | ICD-10-CM | POA: Diagnosis not present

## 2016-07-18 DIAGNOSIS — R262 Difficulty in walking, not elsewhere classified: Secondary | ICD-10-CM | POA: Diagnosis not present

## 2016-07-18 DIAGNOSIS — M25661 Stiffness of right knee, not elsewhere classified: Secondary | ICD-10-CM | POA: Diagnosis not present

## 2016-07-21 DIAGNOSIS — M25661 Stiffness of right knee, not elsewhere classified: Secondary | ICD-10-CM | POA: Diagnosis not present

## 2016-07-21 DIAGNOSIS — M25561 Pain in right knee: Secondary | ICD-10-CM | POA: Diagnosis not present

## 2016-07-21 DIAGNOSIS — R262 Difficulty in walking, not elsewhere classified: Secondary | ICD-10-CM | POA: Diagnosis not present

## 2016-09-04 DIAGNOSIS — S5011XA Contusion of right forearm, initial encounter: Secondary | ICD-10-CM | POA: Diagnosis not present

## 2016-09-04 DIAGNOSIS — S50811A Abrasion of right forearm, initial encounter: Secondary | ICD-10-CM | POA: Diagnosis not present

## 2016-09-16 NOTE — Addendum Note (Signed)
Addendum  created 09/16/16 1355 by Lyndle Herrlich, MD   Sign clinical note

## 2016-09-16 NOTE — Anesthesia Postprocedure Evaluation (Signed)
Anesthesia Post Note  Patient: [name]  Procedure(s) Performed: Procedure(s) (LRB): TOTAL KNEE REVISION (Right)     Anesthesia Post Evaluation  Last Vitals:  Vitals:   05/31/16 0135 05/31/16 0550  BP: (!) 93/46 (!) 96/52  Pulse: 69 (!) 55  Resp:    Temp: 36.6 C 36.6 C    Last Pain:  Vitals:   05/31/16 1442  TempSrc:   PainSc: 4                  Kathrene Sinopoli EDWARD

## 2016-09-21 DIAGNOSIS — L4050 Arthropathic psoriasis, unspecified: Secondary | ICD-10-CM | POA: Diagnosis not present

## 2016-09-21 DIAGNOSIS — Z79899 Other long term (current) drug therapy: Secondary | ICD-10-CM | POA: Diagnosis not present

## 2016-09-21 DIAGNOSIS — Z0289 Encounter for other administrative examinations: Secondary | ICD-10-CM | POA: Insufficient documentation

## 2016-09-21 DIAGNOSIS — M1990 Unspecified osteoarthritis, unspecified site: Secondary | ICD-10-CM | POA: Diagnosis not present

## 2016-10-17 DIAGNOSIS — L4050 Arthropathic psoriasis, unspecified: Secondary | ICD-10-CM | POA: Diagnosis not present

## 2016-10-17 DIAGNOSIS — L400 Psoriasis vulgaris: Secondary | ICD-10-CM | POA: Diagnosis not present

## 2016-11-03 DIAGNOSIS — L4050 Arthropathic psoriasis, unspecified: Secondary | ICD-10-CM | POA: Diagnosis not present

## 2016-11-03 DIAGNOSIS — L400 Psoriasis vulgaris: Secondary | ICD-10-CM | POA: Diagnosis not present

## 2016-11-10 DIAGNOSIS — Z471 Aftercare following joint replacement surgery: Secondary | ICD-10-CM | POA: Diagnosis not present

## 2016-11-10 DIAGNOSIS — Z96651 Presence of right artificial knee joint: Secondary | ICD-10-CM | POA: Diagnosis not present

## 2016-11-14 DIAGNOSIS — Z9181 History of falling: Secondary | ICD-10-CM | POA: Diagnosis not present

## 2016-11-14 DIAGNOSIS — E119 Type 2 diabetes mellitus without complications: Secondary | ICD-10-CM | POA: Diagnosis not present

## 2016-11-14 DIAGNOSIS — Z23 Encounter for immunization: Secondary | ICD-10-CM | POA: Diagnosis not present

## 2016-11-14 DIAGNOSIS — E782 Mixed hyperlipidemia: Secondary | ICD-10-CM | POA: Diagnosis not present

## 2016-11-14 DIAGNOSIS — M8580 Other specified disorders of bone density and structure, unspecified site: Secondary | ICD-10-CM | POA: Diagnosis not present

## 2016-11-14 DIAGNOSIS — Z6828 Body mass index (BMI) 28.0-28.9, adult: Secondary | ICD-10-CM | POA: Diagnosis not present

## 2016-11-14 DIAGNOSIS — I10 Essential (primary) hypertension: Secondary | ICD-10-CM | POA: Diagnosis not present

## 2016-11-18 DIAGNOSIS — H2513 Age-related nuclear cataract, bilateral: Secondary | ICD-10-CM | POA: Diagnosis not present

## 2016-12-14 DIAGNOSIS — L4050 Arthropathic psoriasis, unspecified: Secondary | ICD-10-CM | POA: Diagnosis not present

## 2016-12-14 DIAGNOSIS — M1990 Unspecified osteoarthritis, unspecified site: Secondary | ICD-10-CM | POA: Diagnosis not present

## 2016-12-15 DIAGNOSIS — Z23 Encounter for immunization: Secondary | ICD-10-CM | POA: Diagnosis not present

## 2017-01-27 DIAGNOSIS — L4050 Arthropathic psoriasis, unspecified: Secondary | ICD-10-CM | POA: Diagnosis not present

## 2017-01-27 DIAGNOSIS — B354 Tinea corporis: Secondary | ICD-10-CM | POA: Diagnosis not present

## 2017-02-16 DIAGNOSIS — R109 Unspecified abdominal pain: Secondary | ICD-10-CM | POA: Diagnosis not present

## 2017-02-16 DIAGNOSIS — R319 Hematuria, unspecified: Secondary | ICD-10-CM | POA: Diagnosis not present

## 2017-02-17 DIAGNOSIS — N200 Calculus of kidney: Secondary | ICD-10-CM | POA: Diagnosis not present

## 2017-02-17 DIAGNOSIS — N3020 Other chronic cystitis without hematuria: Secondary | ICD-10-CM | POA: Diagnosis not present

## 2017-02-17 DIAGNOSIS — N318 Other neuromuscular dysfunction of bladder: Secondary | ICD-10-CM | POA: Diagnosis not present

## 2017-02-17 DIAGNOSIS — R310 Gross hematuria: Secondary | ICD-10-CM | POA: Diagnosis not present

## 2017-02-20 DIAGNOSIS — N3090 Cystitis, unspecified without hematuria: Secondary | ICD-10-CM | POA: Diagnosis not present

## 2017-02-20 DIAGNOSIS — R319 Hematuria, unspecified: Secondary | ICD-10-CM | POA: Diagnosis not present

## 2017-02-24 DIAGNOSIS — N318 Other neuromuscular dysfunction of bladder: Secondary | ICD-10-CM | POA: Diagnosis not present

## 2017-02-24 DIAGNOSIS — R310 Gross hematuria: Secondary | ICD-10-CM | POA: Diagnosis not present

## 2017-02-24 DIAGNOSIS — N200 Calculus of kidney: Secondary | ICD-10-CM | POA: Diagnosis not present

## 2017-02-24 DIAGNOSIS — N3020 Other chronic cystitis without hematuria: Secondary | ICD-10-CM | POA: Diagnosis not present

## 2017-03-01 DIAGNOSIS — Z01818 Encounter for other preprocedural examination: Secondary | ICD-10-CM | POA: Diagnosis not present

## 2017-03-01 DIAGNOSIS — N200 Calculus of kidney: Secondary | ICD-10-CM | POA: Diagnosis not present

## 2017-03-03 DIAGNOSIS — N200 Calculus of kidney: Secondary | ICD-10-CM | POA: Diagnosis not present

## 2017-03-03 DIAGNOSIS — N201 Calculus of ureter: Secondary | ICD-10-CM | POA: Diagnosis not present

## 2017-03-10 DIAGNOSIS — R310 Gross hematuria: Secondary | ICD-10-CM | POA: Diagnosis not present

## 2017-03-10 DIAGNOSIS — N3021 Other chronic cystitis with hematuria: Secondary | ICD-10-CM | POA: Diagnosis not present

## 2017-03-10 DIAGNOSIS — N200 Calculus of kidney: Secondary | ICD-10-CM | POA: Diagnosis not present

## 2017-04-18 DIAGNOSIS — I10 Essential (primary) hypertension: Secondary | ICD-10-CM | POA: Diagnosis not present

## 2017-04-18 DIAGNOSIS — J019 Acute sinusitis, unspecified: Secondary | ICD-10-CM | POA: Diagnosis not present

## 2017-05-22 DIAGNOSIS — N182 Chronic kidney disease, stage 2 (mild): Secondary | ICD-10-CM | POA: Diagnosis not present

## 2017-05-22 DIAGNOSIS — M8580 Other specified disorders of bone density and structure, unspecified site: Secondary | ICD-10-CM | POA: Diagnosis not present

## 2017-05-22 DIAGNOSIS — Z6828 Body mass index (BMI) 28.0-28.9, adult: Secondary | ICD-10-CM | POA: Diagnosis not present

## 2017-05-22 DIAGNOSIS — S46012A Strain of muscle(s) and tendon(s) of the rotator cuff of left shoulder, initial encounter: Secondary | ICD-10-CM | POA: Diagnosis not present

## 2017-06-16 DIAGNOSIS — K219 Gastro-esophageal reflux disease without esophagitis: Secondary | ICD-10-CM | POA: Diagnosis not present

## 2017-06-16 DIAGNOSIS — J309 Allergic rhinitis, unspecified: Secondary | ICD-10-CM | POA: Diagnosis not present

## 2017-06-16 DIAGNOSIS — Z6829 Body mass index (BMI) 29.0-29.9, adult: Secondary | ICD-10-CM | POA: Diagnosis not present

## 2017-06-16 DIAGNOSIS — L710 Perioral dermatitis: Secondary | ICD-10-CM | POA: Diagnosis not present

## 2017-06-21 DIAGNOSIS — L4050 Arthropathic psoriasis, unspecified: Secondary | ICD-10-CM | POA: Diagnosis not present

## 2017-06-21 DIAGNOSIS — Z79899 Other long term (current) drug therapy: Secondary | ICD-10-CM | POA: Diagnosis not present

## 2017-06-21 DIAGNOSIS — M1990 Unspecified osteoarthritis, unspecified site: Secondary | ICD-10-CM | POA: Diagnosis not present

## 2017-07-12 DIAGNOSIS — L578 Other skin changes due to chronic exposure to nonionizing radiation: Secondary | ICD-10-CM | POA: Diagnosis not present

## 2017-07-12 DIAGNOSIS — L400 Psoriasis vulgaris: Secondary | ICD-10-CM | POA: Diagnosis not present

## 2017-09-21 DIAGNOSIS — M25561 Pain in right knee: Secondary | ICD-10-CM | POA: Diagnosis not present

## 2017-09-21 DIAGNOSIS — Z471 Aftercare following joint replacement surgery: Secondary | ICD-10-CM | POA: Diagnosis not present

## 2017-09-21 DIAGNOSIS — Z96651 Presence of right artificial knee joint: Secondary | ICD-10-CM | POA: Diagnosis not present

## 2017-11-17 DIAGNOSIS — I10 Essential (primary) hypertension: Secondary | ICD-10-CM | POA: Diagnosis not present

## 2017-11-18 DIAGNOSIS — N3000 Acute cystitis without hematuria: Secondary | ICD-10-CM | POA: Diagnosis not present

## 2017-11-18 DIAGNOSIS — R300 Dysuria: Secondary | ICD-10-CM | POA: Diagnosis not present

## 2017-11-21 DIAGNOSIS — M5440 Lumbago with sciatica, unspecified side: Secondary | ICD-10-CM | POA: Diagnosis not present

## 2017-11-24 DIAGNOSIS — M810 Age-related osteoporosis without current pathological fracture: Secondary | ICD-10-CM | POA: Diagnosis not present

## 2017-11-24 DIAGNOSIS — Z23 Encounter for immunization: Secondary | ICD-10-CM | POA: Diagnosis not present

## 2017-11-30 DIAGNOSIS — Z6829 Body mass index (BMI) 29.0-29.9, adult: Secondary | ICD-10-CM | POA: Diagnosis not present

## 2017-11-30 DIAGNOSIS — M5440 Lumbago with sciatica, unspecified side: Secondary | ICD-10-CM | POA: Diagnosis not present

## 2017-12-08 DIAGNOSIS — M545 Low back pain: Secondary | ICD-10-CM | POA: Diagnosis not present

## 2017-12-13 DIAGNOSIS — M545 Low back pain: Secondary | ICD-10-CM | POA: Diagnosis not present

## 2017-12-13 DIAGNOSIS — M5416 Radiculopathy, lumbar region: Secondary | ICD-10-CM | POA: Diagnosis not present

## 2017-12-14 DIAGNOSIS — M8448XA Pathological fracture, other site, initial encounter for fracture: Secondary | ICD-10-CM | POA: Diagnosis not present

## 2017-12-18 DIAGNOSIS — I10 Essential (primary) hypertension: Secondary | ICD-10-CM | POA: Diagnosis not present

## 2017-12-18 DIAGNOSIS — E785 Hyperlipidemia, unspecified: Secondary | ICD-10-CM | POA: Diagnosis not present

## 2017-12-18 DIAGNOSIS — M1990 Unspecified osteoarthritis, unspecified site: Secondary | ICD-10-CM | POA: Diagnosis not present

## 2017-12-18 DIAGNOSIS — R262 Difficulty in walking, not elsewhere classified: Secondary | ICD-10-CM | POA: Diagnosis not present

## 2017-12-18 DIAGNOSIS — K219 Gastro-esophageal reflux disease without esophagitis: Secondary | ICD-10-CM | POA: Diagnosis not present

## 2017-12-18 DIAGNOSIS — Z79899 Other long term (current) drug therapy: Secondary | ICD-10-CM | POA: Diagnosis not present

## 2017-12-18 DIAGNOSIS — M8448XA Pathological fracture, other site, initial encounter for fracture: Secondary | ICD-10-CM | POA: Diagnosis not present

## 2017-12-18 DIAGNOSIS — F329 Major depressive disorder, single episode, unspecified: Secondary | ICD-10-CM | POA: Diagnosis not present

## 2017-12-18 DIAGNOSIS — M48061 Spinal stenosis, lumbar region without neurogenic claudication: Secondary | ICD-10-CM | POA: Diagnosis not present

## 2017-12-18 DIAGNOSIS — N3000 Acute cystitis without hematuria: Secondary | ICD-10-CM | POA: Diagnosis not present

## 2017-12-18 DIAGNOSIS — M5116 Intervertebral disc disorders with radiculopathy, lumbar region: Secondary | ICD-10-CM | POA: Diagnosis not present

## 2017-12-22 DIAGNOSIS — M8448XD Pathological fracture, other site, subsequent encounter for fracture with routine healing: Secondary | ICD-10-CM | POA: Diagnosis not present

## 2017-12-22 DIAGNOSIS — G8929 Other chronic pain: Secondary | ICD-10-CM | POA: Diagnosis not present

## 2017-12-22 DIAGNOSIS — R531 Weakness: Secondary | ICD-10-CM | POA: Diagnosis not present

## 2017-12-22 DIAGNOSIS — I10 Essential (primary) hypertension: Secondary | ICD-10-CM | POA: Diagnosis not present

## 2017-12-25 DIAGNOSIS — I341 Nonrheumatic mitral (valve) prolapse: Secondary | ICD-10-CM | POA: Diagnosis not present

## 2017-12-25 DIAGNOSIS — Z7951 Long term (current) use of inhaled steroids: Secondary | ICD-10-CM | POA: Diagnosis not present

## 2017-12-25 DIAGNOSIS — Z791 Long term (current) use of non-steroidal anti-inflammatories (NSAID): Secondary | ICD-10-CM | POA: Diagnosis not present

## 2017-12-25 DIAGNOSIS — M8008XD Age-related osteoporosis with current pathological fracture, vertebra(e), subsequent encounter for fracture with routine healing: Secondary | ICD-10-CM | POA: Diagnosis not present

## 2017-12-25 DIAGNOSIS — M5136 Other intervertebral disc degeneration, lumbar region: Secondary | ICD-10-CM | POA: Diagnosis not present

## 2017-12-25 DIAGNOSIS — F329 Major depressive disorder, single episode, unspecified: Secondary | ICD-10-CM | POA: Diagnosis not present

## 2017-12-25 DIAGNOSIS — M8580 Other specified disorders of bone density and structure, unspecified site: Secondary | ICD-10-CM | POA: Diagnosis not present

## 2017-12-25 DIAGNOSIS — I10 Essential (primary) hypertension: Secondary | ICD-10-CM | POA: Diagnosis not present

## 2017-12-25 DIAGNOSIS — E7800 Pure hypercholesterolemia, unspecified: Secondary | ICD-10-CM | POA: Diagnosis not present

## 2017-12-25 DIAGNOSIS — M1991 Primary osteoarthritis, unspecified site: Secondary | ICD-10-CM | POA: Diagnosis not present

## 2017-12-25 DIAGNOSIS — J45909 Unspecified asthma, uncomplicated: Secondary | ICD-10-CM | POA: Diagnosis not present

## 2017-12-25 DIAGNOSIS — Z7982 Long term (current) use of aspirin: Secondary | ICD-10-CM | POA: Diagnosis not present

## 2017-12-25 DIAGNOSIS — K219 Gastro-esophageal reflux disease without esophagitis: Secondary | ICD-10-CM | POA: Diagnosis not present

## 2017-12-25 DIAGNOSIS — E119 Type 2 diabetes mellitus without complications: Secondary | ICD-10-CM | POA: Diagnosis not present

## 2017-12-27 ENCOUNTER — Encounter (HOSPITAL_COMMUNITY): Payer: Self-pay

## 2017-12-27 ENCOUNTER — Other Ambulatory Visit: Payer: Self-pay

## 2017-12-27 ENCOUNTER — Inpatient Hospital Stay (HOSPITAL_COMMUNITY)
Admission: EM | Admit: 2017-12-27 | Discharge: 2017-12-30 | DRG: 690 | Disposition: A | Discharge status: Skilled Nursing Facility | Payer: Medicare Other | Attending: Internal Medicine | Admitting: Internal Medicine

## 2017-12-27 DIAGNOSIS — J45909 Unspecified asthma, uncomplicated: Secondary | ICD-10-CM | POA: Diagnosis present

## 2017-12-27 DIAGNOSIS — Z7951 Long term (current) use of inhaled steroids: Secondary | ICD-10-CM

## 2017-12-27 DIAGNOSIS — K0889 Other specified disorders of teeth and supporting structures: Secondary | ICD-10-CM | POA: Diagnosis not present

## 2017-12-27 DIAGNOSIS — Z87442 Personal history of urinary calculi: Secondary | ICD-10-CM

## 2017-12-27 DIAGNOSIS — J309 Allergic rhinitis, unspecified: Secondary | ICD-10-CM | POA: Diagnosis not present

## 2017-12-27 DIAGNOSIS — M8088XG Other osteoporosis with current pathological fracture, vertebra(e), subsequent encounter for fracture with delayed healing: Secondary | ICD-10-CM | POA: Diagnosis not present

## 2017-12-27 DIAGNOSIS — E785 Hyperlipidemia, unspecified: Secondary | ICD-10-CM | POA: Diagnosis present

## 2017-12-27 DIAGNOSIS — Z8249 Family history of ischemic heart disease and other diseases of the circulatory system: Secondary | ICD-10-CM

## 2017-12-27 DIAGNOSIS — R3129 Other microscopic hematuria: Secondary | ICD-10-CM | POA: Diagnosis not present

## 2017-12-27 DIAGNOSIS — Z7982 Long term (current) use of aspirin: Secondary | ICD-10-CM

## 2017-12-27 DIAGNOSIS — M8000XA Age-related osteoporosis with current pathological fracture, unspecified site, initial encounter for fracture: Secondary | ICD-10-CM | POA: Diagnosis present

## 2017-12-27 DIAGNOSIS — R52 Pain, unspecified: Secondary | ICD-10-CM | POA: Diagnosis not present

## 2017-12-27 DIAGNOSIS — L4050 Arthropathic psoriasis, unspecified: Secondary | ICD-10-CM | POA: Diagnosis not present

## 2017-12-27 DIAGNOSIS — I341 Nonrheumatic mitral (valve) prolapse: Secondary | ICD-10-CM | POA: Diagnosis not present

## 2017-12-27 DIAGNOSIS — K219 Gastro-esophageal reflux disease without esophagitis: Secondary | ICD-10-CM | POA: Diagnosis present

## 2017-12-27 DIAGNOSIS — I10 Essential (primary) hypertension: Secondary | ICD-10-CM | POA: Diagnosis present

## 2017-12-27 DIAGNOSIS — R062 Wheezing: Secondary | ICD-10-CM | POA: Diagnosis not present

## 2017-12-27 DIAGNOSIS — F418 Other specified anxiety disorders: Secondary | ICD-10-CM | POA: Diagnosis present

## 2017-12-27 DIAGNOSIS — T782XXD Anaphylactic shock, unspecified, subsequent encounter: Secondary | ICD-10-CM | POA: Diagnosis not present

## 2017-12-27 DIAGNOSIS — Z96653 Presence of artificial knee joint, bilateral: Secondary | ICD-10-CM | POA: Diagnosis present

## 2017-12-27 DIAGNOSIS — M1990 Unspecified osteoarthritis, unspecified site: Secondary | ICD-10-CM | POA: Diagnosis present

## 2017-12-27 DIAGNOSIS — Z90710 Acquired absence of both cervix and uterus: Secondary | ICD-10-CM | POA: Diagnosis not present

## 2017-12-27 DIAGNOSIS — R011 Cardiac murmur, unspecified: Secondary | ICD-10-CM | POA: Diagnosis present

## 2017-12-27 DIAGNOSIS — M62838 Other muscle spasm: Secondary | ICD-10-CM | POA: Diagnosis not present

## 2017-12-27 DIAGNOSIS — F419 Anxiety disorder, unspecified: Secondary | ICD-10-CM | POA: Diagnosis present

## 2017-12-27 DIAGNOSIS — M48061 Spinal stenosis, lumbar region without neurogenic claudication: Secondary | ICD-10-CM | POA: Diagnosis present

## 2017-12-27 DIAGNOSIS — A499 Bacterial infection, unspecified: Secondary | ICD-10-CM | POA: Diagnosis not present

## 2017-12-27 DIAGNOSIS — S3210XD Unspecified fracture of sacrum, subsequent encounter for fracture with routine healing: Secondary | ICD-10-CM | POA: Diagnosis not present

## 2017-12-27 DIAGNOSIS — K0530 Chronic periodontitis, unspecified: Secondary | ICD-10-CM | POA: Diagnosis present

## 2017-12-27 DIAGNOSIS — M5416 Radiculopathy, lumbar region: Secondary | ICD-10-CM | POA: Diagnosis not present

## 2017-12-27 DIAGNOSIS — N390 Urinary tract infection, site not specified: Secondary | ICD-10-CM | POA: Diagnosis present

## 2017-12-27 DIAGNOSIS — M8448XA Pathological fracture, other site, initial encounter for fracture: Secondary | ICD-10-CM

## 2017-12-27 DIAGNOSIS — D649 Anemia, unspecified: Secondary | ICD-10-CM | POA: Diagnosis present

## 2017-12-27 DIAGNOSIS — M545 Low back pain: Secondary | ICD-10-CM | POA: Diagnosis not present

## 2017-12-27 DIAGNOSIS — M8448XG Pathological fracture, other site, subsequent encounter for fracture with delayed healing: Secondary | ICD-10-CM | POA: Diagnosis not present

## 2017-12-27 DIAGNOSIS — R339 Retention of urine, unspecified: Secondary | ICD-10-CM | POA: Diagnosis not present

## 2017-12-27 DIAGNOSIS — R0602 Shortness of breath: Secondary | ICD-10-CM | POA: Diagnosis not present

## 2017-12-27 DIAGNOSIS — M8448XD Pathological fracture, other site, subsequent encounter for fracture with routine healing: Secondary | ICD-10-CM

## 2017-12-27 DIAGNOSIS — Z79899 Other long term (current) drug therapy: Secondary | ICD-10-CM | POA: Diagnosis not present

## 2017-12-27 DIAGNOSIS — R531 Weakness: Secondary | ICD-10-CM

## 2017-12-27 DIAGNOSIS — M810 Age-related osteoporosis without current pathological fracture: Secondary | ICD-10-CM | POA: Diagnosis present

## 2017-12-27 DIAGNOSIS — F329 Major depressive disorder, single episode, unspecified: Secondary | ICD-10-CM | POA: Diagnosis not present

## 2017-12-27 LAB — CBC WITH DIFFERENTIAL/PLATELET
ABS IMMATURE GRANULOCYTES: 0.08 10*3/uL — AB (ref 0.00–0.07)
BASOS PCT: 0 %
Basophils Absolute: 0 10*3/uL (ref 0.0–0.1)
EOS ABS: 0.7 10*3/uL — AB (ref 0.0–0.5)
Eosinophils Relative: 8 %
HEMATOCRIT: 40.5 % (ref 36.0–46.0)
Hemoglobin: 11.9 g/dL — ABNORMAL LOW (ref 12.0–15.0)
IMMATURE GRANULOCYTES: 1 %
LYMPHS ABS: 1.6 10*3/uL (ref 0.7–4.0)
Lymphocytes Relative: 18 %
MCH: 24.2 pg — AB (ref 26.0–34.0)
MCHC: 29.4 g/dL — ABNORMAL LOW (ref 30.0–36.0)
MCV: 82.3 fL (ref 80.0–100.0)
MONOS PCT: 8 %
Monocytes Absolute: 0.7 10*3/uL (ref 0.1–1.0)
NEUTROS ABS: 5.5 10*3/uL (ref 1.7–7.7)
NEUTROS PCT: 65 %
Platelets: 287 10*3/uL (ref 150–400)
RBC: 4.92 MIL/uL (ref 3.87–5.11)
RDW: 14.3 % (ref 11.5–15.5)
WBC: 8.5 10*3/uL (ref 4.0–10.5)
nRBC: 0 % (ref 0.0–0.2)

## 2017-12-27 LAB — COMPREHENSIVE METABOLIC PANEL
ALBUMIN: 3.4 g/dL — AB (ref 3.5–5.0)
ALK PHOS: 196 U/L — AB (ref 38–126)
ALT: 10 U/L (ref 0–44)
ANION GAP: 5 (ref 5–15)
AST: 17 U/L (ref 15–41)
BILIRUBIN TOTAL: 0.5 mg/dL (ref 0.3–1.2)
BUN: 16 mg/dL (ref 8–23)
CALCIUM: 8.5 mg/dL — AB (ref 8.9–10.3)
CO2: 22 mmol/L (ref 22–32)
Chloride: 113 mmol/L — ABNORMAL HIGH (ref 98–111)
Creatinine, Ser: 0.75 mg/dL (ref 0.44–1.00)
GFR calc Af Amer: >60 mL/min (ref >60)
GFR calc non Af Amer: >60 mL/min (ref >60)
GLUCOSE: 127 mg/dL — AB (ref 70–99)
Potassium: 3.1 mmol/L — ABNORMAL LOW (ref 3.5–5.1)
SODIUM: 140 mmol/L (ref 135–145)
TOTAL PROTEIN: 6.8 g/dL (ref 6.5–8.1)

## 2017-12-27 LAB — URINALYSIS, ROUTINE W REFLEX MICROSCOPIC
Bilirubin Urine: NEGATIVE
Glucose, UA: NEGATIVE mg/dL
KETONES UR: NEGATIVE mg/dL
Nitrite: NEGATIVE
Protein, ur: NEGATIVE mg/dL
Specific Gravity, Urine: 1.027 (ref 1.005–1.030)
pH: 5 (ref 5.0–8.0)

## 2017-12-27 MED ORDER — ENSURE ENLIVE PO LIQD
237.0000 mL | Freq: Two times a day (BID) | ORAL | Status: DC
  Administered 2017-12-28 – 2017-12-30 (×5): 237 mL via ORAL

## 2017-12-27 MED ORDER — BUSPIRONE HCL 10 MG PO TABS
30.0000 mg | ORAL_TABLET | Freq: Every day | ORAL | Status: DC | PRN
  Administered 2017-12-28 – 2017-12-29 (×2): 30 mg via ORAL
  Filled 2017-12-27 (×2): qty 3

## 2017-12-27 MED ORDER — AMLODIPINE BESY-BENAZEPRIL HCL 5-40 MG PO CAPS: 1.0000 | ORAL_CAPSULE | Freq: Every evening | ORAL | Status: DC

## 2017-12-27 MED ORDER — AMLODIPINE BESYLATE 5 MG PO TABS
5.0000 mg | ORAL_TABLET | Freq: Every evening | ORAL | Status: DC
  Administered 2017-12-27 – 2017-12-29 (×3): 5 mg via ORAL
  Filled 2017-12-27 (×3): qty 1

## 2017-12-27 MED ORDER — LORATADINE 10 MG PO TABS
10.0000 mg | ORAL_TABLET | Freq: Every day | ORAL | Status: DC
  Administered 2017-12-27 – 2017-12-30 (×3): 10 mg via ORAL
  Filled 2017-12-27 (×4): qty 1

## 2017-12-27 MED ORDER — FLUOXETINE HCL 20 MG PO CAPS
40.0000 mg | ORAL_CAPSULE | Freq: Every day | ORAL | Status: DC
  Administered 2017-12-27 – 2017-12-30 (×4): 40 mg via ORAL
  Filled 2017-12-27 (×4): qty 2

## 2017-12-27 MED ORDER — MONTELUKAST SODIUM 10 MG PO TABS
10.0000 mg | ORAL_TABLET | Freq: Every day | ORAL | Status: DC
  Administered 2017-12-28 – 2017-12-29 (×2): 10 mg via ORAL
  Filled 2017-12-27 (×2): qty 1

## 2017-12-27 MED ORDER — ALBUTEROL SULFATE (2.5 MG/3ML) 0.083% IN NEBU: 2.5000 mg | INHALATION_SOLUTION | Freq: Four times a day (QID) | RESPIRATORY_TRACT | Status: DC | PRN

## 2017-12-27 MED ORDER — ENOXAPARIN SODIUM 40 MG/0.4ML ~~LOC~~ SOLN
40.0000 mg | SUBCUTANEOUS | Status: DC
  Administered 2017-12-27 – 2017-12-29 (×3): 40 mg via SUBCUTANEOUS
  Filled 2017-12-27 (×3): qty 0.4

## 2017-12-27 MED ORDER — POLYETHYLENE GLYCOL 3350 17 G PO PACK
17.0000 g | PACK | Freq: Every day | ORAL | Status: DC
  Filled 2017-12-27 (×4): qty 1

## 2017-12-27 MED ORDER — FLUTICASONE FUROATE-VILANTEROL 200-25 MCG/INH IN AEPB
1.0000 | INHALATION_SPRAY | Freq: Every day | RESPIRATORY_TRACT | Status: DC | PRN
  Filled 2017-12-27: qty 28

## 2017-12-27 MED ORDER — ALBUTEROL SULFATE HFA 108 (90 BASE) MCG/ACT IN AERS: 2.0000 | INHALATION_SPRAY | Freq: Four times a day (QID) | RESPIRATORY_TRACT | Status: DC | PRN

## 2017-12-27 MED ORDER — OXYCODONE HCL 5 MG PO TABS
5.0000 mg | ORAL_TABLET | Freq: Four times a day (QID) | ORAL | Status: DC | PRN
  Administered 2017-12-28 – 2017-12-30 (×4): 5 mg via ORAL
  Filled 2017-12-27 (×4): qty 1

## 2017-12-27 MED ORDER — FAMOTIDINE 20 MG PO TABS: 20.0000 mg | ORAL_TABLET | Freq: Every day | ORAL | Status: DC | PRN

## 2017-12-27 MED ORDER — PRAVASTATIN SODIUM 40 MG PO TABS
40.0000 mg | ORAL_TABLET | Freq: Every day | ORAL | Status: DC
  Administered 2017-12-27 – 2017-12-29 (×3): 40 mg via ORAL
  Filled 2017-12-27 (×3): qty 1

## 2017-12-27 MED ORDER — ACETAMINOPHEN 325 MG PO TABS
650.0000 mg | ORAL_TABLET | ORAL | Status: DC | PRN
  Administered 2017-12-27: 650 mg via ORAL
  Filled 2017-12-27: qty 2

## 2017-12-27 MED ORDER — BENAZEPRIL HCL 20 MG PO TABS
40.0000 mg | ORAL_TABLET | Freq: Every evening | ORAL | Status: DC
  Administered 2017-12-27 – 2017-12-29 (×3): 40 mg via ORAL
  Filled 2017-12-27 (×3): qty 2

## 2017-12-27 NOTE — Clinical Social Work Note (Signed)
Clinical Social Work Assessment  Patient Details  Name: [name] MRN: [id number] Date of Birth: 03-21-1950  Date of referral:  12/27/17               Reason for consult:  Facility Placement                Permission sought to share information with:  Family Supports, Chartered certified accountant granted to share information::  Yes, Verbal Permission Granted  Name::     Engineer, maintenance (IT) (husband)  Agency::     Relationship::     Contact Information:     Housing/Transportation Living arrangements for the past 2 months:  [place] of Information:  Patient, Spouse Patient Interpreter Needed:  None Criminal Activity/Legal Involvement Pertinent to Current Situation/Hospitalization:    Significant Relationships:  Spouse Lives with:  Spouse(Spouse is a truck driver, on the road a lot) Do you feel safe going back to the place where you live?  No Need for family participation in patient care:  Yes (Comment)  Care giving concerns:  CSW consulted for SNF placement. Pt unable to ambulate. This is a sudden change over the past 2 weeks.   Social Worker assessment / plan:  CSW met with pt at bedside. Pt's husband, Rolla Etienne, with pt at bedside. Pt came at the direction of her doctor for assistance with orthopedic issues and potential placement. Pt is unable to walk and is looking to go to SNF for rehab and recover strength. Pt understanding that for Medicare to pay for SNF, pt requires 3 night qualifying stay as an inpatient status. Pt is to be admitted under inpatient at this time. Pt understanding that she will have to have a physical therapy evaluation that states SNF is needed.   CSW provided list of SNFs in the area. Pt and pt's husband reviewing SNF list and will select their 3 top choices.   Employment status:  Retired Forensic scientist:  Medicare PT Recommendations:  Not assessed at this time Information / Referral to community resources:  [place]  Patient/Family's Response to care:  Pt and pt's husband agreeable to plan of care and understanding of the steps needed for SNF placement.  Patient/Family's Understanding of and Emotional Response to Diagnosis, Current Treatment, and Prognosis:  Pt and pt's husband did not have any questions for CSW at this time. Pt and pt's husband reviewing SNF list at this time.   Emotional Assessment Appearance:  Appears stated age Attitude/Demeanor/Rapport:    Affect (typically observed):  Accepting, Adaptable, Appropriate, Pleasant Orientation:  Oriented to Self, Oriented to Place, Oriented to  Time, Oriented to Situation Alcohol / Substance use:    Psych involvement (Current and /or in the community):     Discharge Needs  Concerns to be addressed:  Care Coordination Readmission within the last 30 days:  No Current discharge risk:  None Barriers to Discharge:  Continued Medical Work up   [employer], LCSW 12/27/2017, 6:19 PM

## 2017-12-27 NOTE — Progress Notes (Addendum)
CSW met with pt at pt's bedside with pt's husband. Pt and pt's husband explained that pt was working with home health provider, [facility], today when she was unable to stand. Pt stated that she has been unable to walk for the past 2 weeks. Prior to that, pt was ambulating with little difficulty. Pt and pt's husband are hoping to have pt go to SNF. Pt and pt's husband aware of requiring 3 night qualifying stay for SNF. Pt is not eligible for [facility] 3 night qualifying stay waiver because of not being on the [facility] registry.   Pt is undergoing continued medical work up. Per chart, pt's disposition changed to admit. CSW provided list of SNF in the area and asked pt to pick top 3. CSW explained that SNF placement is based on bed availability and SNF ability to meet pt's needs. CSW explained that PT would also need to evaluate pt.   Plan: CSW will leave handoff for daytime floor CSW to follow up with pt.   Wendelyn Breslow, [facility]  [phone]

## 2017-12-27 NOTE — ED Provider Notes (Signed)
[facility] EMERGENCY DEPARTMENT Provider Note   CSN: [id number] Arrival date & time: 12/27/17  1152     History   Chief Complaint Chief Complaint  Patient presents with  . Back Pain    HPI [name] is a 67 y.o. female with a past medical history of osteoporosis, mitral valve prolapse, hypertension, who presents today from neurosurgery for evaluation of back pain.  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  She has been noted to be very weak as a result of this.  She reports that she has not had temperatures over 100.4 at home, denies any loss of control of bowel/bladder function.  She denies abdominal pain.  Says that previously she had a UTI however that is been treated and her symptoms have resolved.  She has a note from neurosurgery PA with [facility] where she went today.  She says she spends most of her time at home alone as her husband is a Administrator and not home.  She says home health came out today but she was unable to bear weight on her right leg and therefore unable to walk.  She has reported tingling in her bilateral feet, right worse than left.  No numbness or tingling across upper inner thighs or genital area.  HPI  Past Medical History:  Diagnosis Date  . Anemia   . Anxiety   . Arthritis   . Asthma   . Depression   . GERD (gastroesophageal reflux disease)   . Heart murmur    Mitral Valve prolapse  . History of kidney stones   . Hypertension   . PONV (postoperative nausea and vomiting)     Patient Active Problem List   Diagnosis Date Noted  . 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 Asthma 11/12/2014  . Allergic rhinoconjunctivitis 11/12/2014  . GERD (gastroesophageal reflux disease) 11/12/2014    Past Surgical History:  Procedure Laterality Date  . ABDOMINAL HYSTERECTOMY    . JOINT REPLACEMENT     right and left knee replacement    . kidney stone removed    . TONSILLECTOMY    . TOTAL KNEE REVISION Right 05/30/2016   Procedure: TOTAL KNEE REVISION;  Surgeon: Vickey Huger, MD;  Location: [facility];  Service: Orthopedics;  Laterality: Right;  . WRIST ARTHROPLASTY Left      OB History   None      Home Medications    Prior to Admission medications   Medication Sig Start Date End Date Taking? Authorizing Provider  albuterol (PROVENTIL HFA;VENTOLIN HFA) 108 (90 BASE) MCG/ACT inhaler Inhale 2 puffs into the lungs every 6 (six) hours as needed for wheezing or shortness of breath.    [provider]  albuterol (PROVENTIL) (2.5 MG/3ML) 0.083% nebulizer solution Take 2.5 mg by nebulization every 6 (six) hours as needed (for wheezing/shortness of breath).     [provider]  amLODipine-benazepril (LOTREL) 5-40 MG capsule Take 1 capsule by mouth every evening.     [provider]  aspirin EC 325 MG EC tablet Take 1 tablet (325 mg total) by mouth 2 (two) times daily. 05/31/16   Donia Ast, PA  beclomethasone (QVAR) 80 MCG/ACT inhaler Inhale 2 puffs into the lungs daily as needed (for respiratory issues.).     [provider]  budesonide-formoterol (SYMBICORT) 160-4.5 MCG/ACT inhaler Inhale 2 puffs into the lungs 2 (two) times daily as needed. For respiratory  issue    [provider]  busPIRone (BUSPAR) 30 MG tablet Take 30 mg by mouth daily as needed (for anxiety.).    [provider]  BUTRANS 10 MCG/HR PTWK patch Place 10 mcg onto the skin 7 days.  03/28/16   [provider]  Calcium-Magnesium-Vitamin D (CALCIUM 500 PO) Take 500 mg by mouth 3 (three) times daily.    [provider]  celecoxib (CELEBREX) 200 MG capsule Take 200 mg by mouth 2 (two) times daily as needed (for pain/inflammation).    [provider]  denosumab (PROLIA) 60 MG/ML SOLN injection Inject 60 mg into the skin every 6 (six) months.    [provider]  EPINEPHrine 0.3  mg/0.3 mL IJ SOAJ injection Inject 0.3 mg into the muscle daily. For anaphylactic allergic reaction.    [provider]  fexofenadine (ALLEGRA) 180 MG tablet TAKE 1 TABLET DAILY 09/03/15   Kozlow, Donnamarie Poag, MD  FLUoxetine (PROZAC) 40 MG capsule Take 40 mg by mouth daily. 04/19/16   [provider]  fluticasone (FLONASE) 50 MCG/ACT nasal spray Place 1 spray into both nostrils as needed for allergies or rhinitis. 01/26/15   Kozlow, Donnamarie Poag, MD  methocarbamol (ROBAXIN) 500 MG tablet Take 1-2 tablets (500-1,000 mg total) by mouth every 6 (six) hours as needed for muscle spasms. 05/31/16   Donia Ast, PA  montelukast (SINGULAIR) 10 MG tablet Take 10 mg by mouth daily at 6 PM.     [provider]  Multiple Vitamin (MULTIVITAMIN WITH MINERALS) TABS tablet Take 1 tablet by mouth daily. 1700 (2 gummies)    [provider]  Omega-3 Fatty Acids (FISH OIL) 1200 MG CAPS Take 2,400 mg by mouth daily at 6 PM.    [provider]  OTEZLA 30 MG TABS Take 30 mg by mouth daily. 02/10/16   [provider]  oxyCODONE 10 MG TABS Take 1 tablet (10 mg total) by mouth every 3 (three) hours as needed for breakthrough pain. 05/31/16   Donia Ast, PA  pravastatin (PRAVACHOL) 40 MG tablet Take 40 mg by mouth at bedtime.    [provider]  ranitidine (ZANTAC) 150 MG tablet Take 150 mg by mouth 2 (two) times daily.    [provider]  traMADol (ULTRAM) 50 MG tablet Take 50 mg by mouth 3 (three) times daily as needed for pain. 04/26/16   [provider]    Family History Family History  Problem Relation Age of Onset  . Heart attack Brother     Social History Social History   Tobacco Use  . Smoking status: Never Smoker  . Smokeless tobacco: Never Used  Substance Use Topics  . Alcohol use: No  . Drug use: No     Allergies   No known allergies   Review of Systems Review of Systems  Constitutional: Negative for chills and  fever.  Cardiovascular: Negative for chest pain.  Gastrointestinal: Negative for abdominal pain, nausea and vomiting.  Genitourinary: Negative for difficulty urinating, dysuria, pelvic pain and urgency.  Musculoskeletal: Positive for back pain. Negative for neck pain and neck stiffness.  Skin: Negative for color change, rash and wound.  Neurological: Positive for weakness.  All other systems reviewed and are negative.    Physical Exam Updated Vital Signs BP 128/84 (BP Location: Right Arm)   Pulse 82   Temp 98.9 F (37.2 C) (Oral)   Resp 18   Ht 5\' 1"  (1.549 m)   Wt 65.8  kg   SpO2 96%   BMI 27.40 kg/m   Physical Exam  Constitutional: She appears well-developed and well-nourished. No distress.  HENT:  Head: Normocephalic and atraumatic.  Eyes: Conjunctivae are normal. Right eye exhibits no discharge. Left eye exhibits no discharge. No scleral icterus.  Neck: Normal range of motion.  Cardiovascular: Normal rate, regular rhythm and intact distal pulses.  Pulmonary/Chest: Effort normal. No stridor. No respiratory distress.  Abdominal: Soft. She exhibits no distension. There is no tenderness.  Musculoskeletal: She exhibits no edema or deformity.  Able to lift bilateral legs off bed, strength 4/5.  Unable to bear weight.   Diffuse TTP over the lower back.   Neurological: She is alert. She exhibits normal muscle tone.  Decreased sensation across bilateral feet to light touch.  Otherwise sensation to light touch intact over bilateral legs.   Skin: Skin is warm and dry. She is not diaphoretic.  Psychiatric: She has a normal mood and affect. Her behavior is normal.  Nursing note and vitals reviewed.    ED Treatments / Results  Labs (all labs ordered are listed, but only abnormal results are displayed) Labs Reviewed  COMPREHENSIVE METABOLIC PANEL  CBC WITH DIFFERENTIAL/PLATELET  URINALYSIS, ROUTINE W REFLEX MICROSCOPIC    EKG None  Radiology No results found.  Per [facility]  note in [place] ED on 12/22/17 Patient had a recent MRI showing "bilateral sacral insufficiency fractures, R>L. 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  Pertinent labs & imaging results that were available during my care of the patient were reviewed by me and considered in my medical decision making (see chart for details).    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  She primarily is requesting help getting an admission for into rehab so they recommended due to weakness causing significant decreased mobility.  I spoke with case management, and they are attempting to pursue all available resources for the patient.  Patient does not have concerns for cauda equina syndrome with no changes to bowel/bladder function, no numbness or tingling across upper inner thighs or genitals.   At shift change care was transferred to [facility] PA-C who will follow pending studies, re-evaulate and determine disposition.      Final Clinical Impressions(s) / ED Diagnoses   Final diagnoses:  Sacral insufficiency fracture with routine healing, subsequent encounter  Weakness    ED Discharge Orders    None       Ollen Gross 12/27/17 1528    Dorie Rank, MD 12/30/17 1040

## 2017-12-27 NOTE — H&P (Signed)
Date: 12/27/2017               Patient Name:  [name] MRN: [id number]  DOB: 1950/06/28 Age / Sex: 67 y.o., female   PCP: Cyndi Bender, PA-C         Medical Service: Internal Medicine Teaching Service         Attending Physician: Dr. Lucious Groves, DO    First Contact: Dr. Alfonse Spruce Pager: [phone]  Second Contact: Dr. Shan Levans Pager: [phone]       After Hours (After 5p/  First Contact Pager: [phone]  weekends / holidays): Second Contact Pager: [phone]   Chief Complaint:   History of Present Illness: [name] is a 67 year old female with past medical history of osteoporosis, hypertension, MVP, psoriatic arthritis, presented from neurosurgery office for evaluation of back pain and difficulty walking. 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  She mentions that about a month ago she suddenly developed pain at her lower back and sacral area mostly at the right.  It was a dull/sharp pain that got worse by walking or standing.  She was initially treated with steroids and muscle relaxant and as if not improved, underwent x-ray that show showed the fractures as mentioned above.  Reported as nonoperable.  She has been managing the pain with heat pad, Tylenol 650 and Vicodin that gave her partial relief .in the past few days, she had weakness, numbness and tingling at her right lower extremity.  The pain also increased to 10 out of 10.  Sometimes radiates to the back of her legs and knees.  Denies any trauma.  No fever, no rash, no urinary symptoms.  No diarrhea, constipation. she was getting her PT yesterday, where found not able to ambulate well and referred to ED. she denies any recent urinary or fecal incontinence he.  Sometimes has numbness associated with pain at sacral area but denies any actual sensory deficit there.    Meds: No outpatient medications have been marked as taking for the 12/27/17  encounter [facility] Encounter).     Allergies: Allergies as of 12/27/2017 - Review Complete 12/27/2017  Allergen Reaction Noted  . No known allergies  05/29/2016   Past Medical History:  Diagnosis Date  . Anemia   . Anxiety   . Arthritis   . Asthma   . Depression   . GERD (gastroesophageal reflux disease)   . Heart murmur    Mitral Valve prolapse  . History of kidney stones   . Hypertension   . PONV (postoperative nausea and vomiting)     Family History:  MI in brother  Social History:  Never smoked Denies alcohol use Denies drug history  Review of Systems: A complete ROS was negative except as per HPI.   Physical Exam: Blood pressure 128/84, pulse 82, temperature 98.9 F (37.2 C), temperature source Oral, resp. rate 18, height 5\' 1"  (1.549 m), weight 65.8 kg, SpO2 96 %.   Physical Exam  Vital signs reviewed Constitutional: She is oriented to person, place, and time. She appears well-developed and well-nourished. No distress.  HENT:  Head: Normocephalic and atraumatic.  Eyes: EOM are normal.  Cardiovascular: Normal rate, regular rhythm, normal heart sounds and intact distal pulses. Exam reveals no friction rub.  No murmur heard. Pulmonary/Chest: Effort normal and breath sounds normal. No respiratory distress. She has no wheezes. She has no rales.  Abdominal: Soft. Bowel sounds  are normal. She exhibits no distension. There is no tenderness. There is no rebound and no guarding.  Musculoskeletal: She exhibits tenderness at the lumbar spine, local tenderness bilateral gluteal area. She exhibits no edema or deformity.  Neurological: She is alert and oriented to person, place, and time.  sensation and movement is intact in bilateral at face.Motor is 5/5 at upper extremities and proximal of lower extremities.  sensory deficit is present. She exhibits muscle tone at 3/5 at distal of right lower extremity .  Mild decreased sensation at right distal lower extremity.   Skin:  Skin is warm and dry. No rash noted. She is not diaphoretic.  Psychiatric: She has a normal mood and affect. Her behavior is normal. Judgment and thought content normal.   Recent out of hospital MRI:  Per [facility] note in [place] ED on 12/22/17 Patient had a recent MRI showing "bilateral sacral insufficiency fractures, R>L. Left subarticular stenosis L4-5 with left L5 nerve root impingement and moderate spinal stenosis." According to note by Bolivar Haw PA  Assessment & Plan by Problem: Active Problems:   Sacral insufficiency fracture with delayed healing [name] is a 67 year old female with past medical history of osteoporosis, hypertension, MVP, psoriatic arthritis, presented from neurosurgery office for evaluation of back pain and difficulty walking.  Back pain, worsening right lower extremity weakness and sensory deficit  Likely secondary to sacral insufficiency fracture/spinal canal stenosis based on recent MRI in setting of osteoporosis.  -Tylenol 650 mg q 4 h PRN for mild to moderate pain -Oxycodone IR 5 mg q6h PRN for moderate pain  -PT/OT consult  Asthma: Stable and asymptomatic.  No wheezes on exam -Continue with albuterol as needed, BREO, loratadine, Singulair  HTN: Current BP 128/84, On amlodipine-benazepril 5-40 mg daily at home -Continue home med.  Hyperlipidemia: -Continue home dose of pravastatin 40 mg daily  Anxiety depression: Stable, Normal mood and affect  -Continue home dose of fluoxetine and buspirone  -GERD: Stable. Ranitidine at home -famotidine  -Psoriatic arthritis: No active lesion. Mentions that has been off of Otezla due to GI effects on her. She says that she follows up with her physician about that.  Code: Full Diet: Lower VTE ppx: Levonox IV f: None  Dispo: Admit patient to Inpatient with expected length of stay greater than 2 midnights.   SignedDewayne Hatch, MD 12/27/2017, 6:21 PM  Pager: [phone]

## 2017-12-27 NOTE — ED Triage Notes (Signed)
Pt arrives in wheelchair from home c/o back pain and pain in hips. Reports decreased ambulation d/t pain. Has Note from neurologist stating that pt needs admission to get into SNF.

## 2017-12-27 NOTE — ED Notes (Signed)
Patient states she was seen here by her Neurologist for further eval of bilateral  Sacral insuffency . States she isn't able to walk has to use a wheelchair and bedpan. Wants to be admitted for 3 days and put in a rehab.

## 2017-12-28 DIAGNOSIS — J45909 Unspecified asthma, uncomplicated: Secondary | ICD-10-CM

## 2017-12-28 DIAGNOSIS — Z79899 Other long term (current) drug therapy: Secondary | ICD-10-CM

## 2017-12-28 DIAGNOSIS — M48061 Spinal stenosis, lumbar region without neurogenic claudication: Secondary | ICD-10-CM

## 2017-12-28 DIAGNOSIS — D649 Anemia, unspecified: Secondary | ICD-10-CM | POA: Diagnosis present

## 2017-12-28 DIAGNOSIS — M8088XG Other osteoporosis with current pathological fracture, vertebra(e), subsequent encounter for fracture with delayed healing: Secondary | ICD-10-CM

## 2017-12-28 DIAGNOSIS — M810 Age-related osteoporosis without current pathological fracture: Secondary | ICD-10-CM | POA: Diagnosis present

## 2017-12-28 DIAGNOSIS — I341 Nonrheumatic mitral (valve) prolapse: Secondary | ICD-10-CM

## 2017-12-28 DIAGNOSIS — F329 Major depressive disorder, single episode, unspecified: Secondary | ICD-10-CM

## 2017-12-28 DIAGNOSIS — N390 Urinary tract infection, site not specified: Principal | ICD-10-CM

## 2017-12-28 DIAGNOSIS — M5416 Radiculopathy, lumbar region: Secondary | ICD-10-CM

## 2017-12-28 DIAGNOSIS — L4050 Arthropathic psoriasis, unspecified: Secondary | ICD-10-CM

## 2017-12-28 DIAGNOSIS — F419 Anxiety disorder, unspecified: Secondary | ICD-10-CM

## 2017-12-28 DIAGNOSIS — I10 Essential (primary) hypertension: Secondary | ICD-10-CM

## 2017-12-28 DIAGNOSIS — M545 Low back pain: Secondary | ICD-10-CM

## 2017-12-28 DIAGNOSIS — R3129 Other microscopic hematuria: Secondary | ICD-10-CM | POA: Diagnosis present

## 2017-12-28 DIAGNOSIS — K219 Gastro-esophageal reflux disease without esophagitis: Secondary | ICD-10-CM

## 2017-12-28 DIAGNOSIS — E785 Hyperlipidemia, unspecified: Secondary | ICD-10-CM

## 2017-12-28 LAB — CBC
HCT: 36.3 % (ref 36.0–46.0)
Hemoglobin: 10.7 g/dL — ABNORMAL LOW (ref 12.0–15.0)
MCH: 24.1 pg — AB (ref 26.0–34.0)
MCHC: 29.5 g/dL — ABNORMAL LOW (ref 30.0–36.0)
MCV: 81.8 fL (ref 80.0–100.0)
PLATELETS: 284 10*3/uL (ref 150–400)
RBC: 4.44 MIL/uL (ref 3.87–5.11)
RDW: 14.4 % (ref 11.5–15.5)
WBC: 7.7 10*3/uL (ref 4.0–10.5)
nRBC: 0 % (ref 0.0–0.2)

## 2017-12-28 LAB — COMPREHENSIVE METABOLIC PANEL
ALT: 11 U/L (ref 0–44)
AST: 17 U/L (ref 15–41)
Albumin: 3 g/dL — ABNORMAL LOW (ref 3.5–5.0)
Alkaline Phosphatase: 161 U/L — ABNORMAL HIGH (ref 38–126)
Anion gap: 7 (ref 5–15)
BUN: 18 mg/dL (ref 8–23)
CO2: 22 mmol/L (ref 22–32)
Calcium: 8.3 mg/dL — ABNORMAL LOW (ref 8.9–10.3)
Chloride: 112 mmol/L — ABNORMAL HIGH (ref 98–111)
Creatinine, Ser: 0.72 mg/dL (ref 0.44–1.00)
GFR calc non Af Amer: >60 mL/min (ref >60)
Glucose, Bld: 102 mg/dL — ABNORMAL HIGH (ref 70–99)
POTASSIUM: 3.5 mmol/L (ref 3.5–5.1)
SODIUM: 141 mmol/L (ref 135–145)
Total Bilirubin: 0.5 mg/dL (ref 0.3–1.2)
Total Protein: 6 g/dL — ABNORMAL LOW (ref 6.5–8.1)

## 2017-12-28 LAB — HIV ANTIBODY (ROUTINE TESTING W REFLEX): HIV SCREEN 4TH GENERATION: NONREACTIVE

## 2017-12-28 MED ORDER — SODIUM CHLORIDE 0.9 % IV SOLN
1.0000 g | INTRAVENOUS | Status: DC
  Administered 2017-12-28 – 2017-12-29 (×2): 1 g via INTRAVENOUS
  Filled 2017-12-28 (×3): qty 10

## 2017-12-28 MED ORDER — LACTATED RINGERS IV SOLN
INTRAVENOUS | Status: AC
  Administered 2017-12-28: 16:00:00 via INTRAVENOUS

## 2017-12-28 MED ORDER — PROSIGHT PO TABS
1.0000 | ORAL_TABLET | Freq: Every day | ORAL | Status: DC
  Administered 2017-12-28 – 2017-12-30 (×3): 1 via ORAL
  Filled 2017-12-28 (×3): qty 1

## 2017-12-28 MED ORDER — OCUVITE-LUTEIN PO CAPS: 1.0000 | ORAL_CAPSULE | Freq: Every day | ORAL | Status: DC

## 2017-12-28 NOTE — Evaluation (Signed)
Occupational Therapy Evaluation Patient Details Name: [name] MRN: [id number] DOB: 1951-01-08 Today's Date: 12/28/2017    History of Present Illness Ms. 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 PMH of osteoporosis, MVP, psoriatic arthritis.    Clinical Impression   PTA Pt with increasing pain and decreased ability needing more care and assit in self-care tasks - most recently she has been using the bed pan and is unable to clean herself properly. Pt is currently mod to max A for LB ADL and min guard assist for transfers. She is motivated to return to indpendent. Skilled OT in the acute setting and afterwards at the SNF level are essential to maximize safety and independence in ADL and functional transfers as well as education for DME/AE.     Follow Up Recommendations  SNF;Supervision/Assistance - 24 hour    Equipment Recommendations    defer to next venue  Recommendations for Other Services       Precautions / Restrictions Precautions Precautions: None Restrictions Weight Bearing Restrictions: No      Mobility Bed Mobility Overal bed mobility: Modified Independent             General bed mobility comments: increased time and use of bed rails   Transfers Overall transfer level: Needs assistance Equipment used: Rolling walker (2 wheeled) Transfers: Sit to/from Stand Sit to Stand: Min guard;Min assist         General transfer comment: min guard assist for safety, vc for hand placement with RW -     Balance Overall balance assessment: Needs assistance Sitting-balance support: Feet supported;No upper extremity supported Sitting balance-Leahy Scale: Good     Standing balance support: Bilateral upper extremity supported Standing balance-Leahy Scale: Poor Standing  balance comment: reliant on RW                           ADL either performed or assessed with clinical judgement   ADL Overall ADL's : Needs assistance/impaired Eating/Feeding: Modified independent;Sitting   Grooming: Set up;Sitting;Wash/dry face;Oral care Grooming Details (indicate cue type and reason): Pt unable to tolerate standing for grooming tasks at this time due to sensation changes and pain, she is able to complete ADL in seated position Upper Body Bathing: Set up;Sitting   Lower Body Bathing: Maximal assistance;Bed level;Sit to/from stand Lower Body Bathing Details (indicate cue type and reason): Pt is able to get tops of thighs, unable reach from the knee down despite excellent effort Upper Body Dressing : Set up;Sitting   Lower Body Dressing: Maximal assistance;Sit to/from stand Lower Body Dressing Details (indicate cue type and reason): assist to manage clothing and to thread feet through bottom Toilet Transfer: Minimal assistance;Stand-pivot;BSC;RW Toilet Transfer Details (indicate cue type and reason): Pt has been using bed pan due to pressure/pain, educated in use of BSC - higher surface, use of arms to un-weight pressure - simulated through recliner transfer to bed this session Toileting- Clothing Manipulation and Hygiene: Maximal assistance Toileting - Clothing Manipulation Details (indicate cue type and reason): "I can get the front, but i can't get the back and I know that I've gotten a UTI from it in the past!"     Functional mobility during ADLs: Minimal assistance;Rolling walker General ADL Comments: limited access to LB due to  pain/sensation changes     Vision Patient Visual Report: No change from baseline       Perception     Praxis      Pertinent Vitals/Pain Pain Assessment: 0-10 Pain Score: 4  Pain Location: R >L gluteal, paraethesia R foot, coccxygeal region  Pain Descriptors / Indicators: Numbness;Radiating;Pins and  needles;Tingling Pain Intervention(s): Limited activity within patient's tolerance;Monitored during session;Repositioned     Hand Dominance Right   Extremity/Trunk Assessment Upper Extremity Assessment Upper Extremity Assessment: Overall WFL for tasks assessed   Lower Extremity Assessment Lower Extremity Assessment: Defer to PT evaluation RLE Deficits / Details: AROM of hip and knee WFL, ankle lacks dorsiflexion, plantar flexion WFL, hip flex 3/5, knee flex 3/5, knee ext 3+/5, ankle dorsi 2/5, plantar 3/5, decreased sensation of entire LE in comparison with L LE RLE Sensation: decreased light touch(stocking distribution of paresthesia with activity/dependent) RLE Coordination: WNL LLE Deficits / Details: AROM WFL, strength grossly assessed 4/5 LLE Sensation: decreased light touch(stocking paresthesia to min calf onset with activity/depende) LLE Coordination: WNL   Cervical / Trunk Assessment Cervical / Trunk Assessment: Normal   Communication Communication Communication: No difficulties   Cognition Arousal/Alertness: Awake/alert Behavior During Therapy: WFL for tasks assessed/performed Overall Cognitive Status: Within Functional Limits for tasks assessed                                     General Comments       Exercises     Shoulder Instructions      Home Living Family/patient expects to be discharged to:: Private residence Living Arrangements: Spouse/significant other Available Help at Discharge: Family;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) Type of Home: House Home Access: Ramped entrance     Home Layout: One level     Bathroom Shower/Tub: Teacher, early years/pre: Standard Bathroom Accessibility: Yes   Home Equipment: Environmental consultant - 2 wheels;Wheelchair - manual;Hand held shower head;Grab bars - tub/shower;Bedside commode;Cane - single point          Prior Functioning/Environment Level of Independence: Independent with  assistive device(s)        Comments: 5 weeks ago independent, 3 weeks ago using cane only able to ambulate 5 feet, last week in bed using bed pan         OT Problem List: Decreased activity tolerance;Impaired balance (sitting and/or standing);Decreased safety awareness      OT Treatment/Interventions: Self-care/ADL training;DME and/or AE instruction;Therapeutic activities;Patient/family education;Balance training    OT Goals(Current goals can be found in the care plan section) Acute Rehab OT Goals Patient Stated Goal: be independent again OT Goal Formulation: With patient Time For Goal Achievement: 01/11/18 Potential to Achieve Goals: Good ADL Goals Pt Will Perform Grooming: with modified independence;standing Pt Will Perform Lower Body Bathing: with modified independence;with adaptive equipment;sit to/from stand Pt Will Perform Lower Body Dressing: with modified independence;with adaptive equipment;sit to/from stand Pt Will Transfer to Toilet: with modified independence;ambulating Pt Will Perform Toileting - Clothing Manipulation and hygiene: with modified independence;sit to/from stand;with adaptive equipment  OT Frequency: Min 2X/week   Barriers to D/C: Decreased caregiver support  Pt's husband works as a Administrator and cannot provide assistance 24 hours       Co-evaluation              AM-PAC PT "6 Clicks" Daily Activity     Outcome Measure Help from another person eating meals?:  None Help from another person taking care of personal grooming?: None(in sitting) Help from another person toileting, which includes using toliet, bedpan, or urinal?: A Little Help from another person bathing (including washing, rinsing, drying)?: A Lot Help from another person to put on and taking off regular upper body clothing?: None Help from another person to put on and taking off regular lower body clothing?: A Lot 6 Click Score: 19   End of Session Equipment Utilized During  Treatment: Gait belt;Rolling walker Nurse Communication: Mobility status;Other (comment)(Pt would really like a bath)  Activity Tolerance: Patient tolerated treatment well Patient left: in bed;with bed alarm set;with call bell/phone within reach  OT Visit Diagnosis: Unsteadiness on feet (R26.81);Other abnormalities of gait and mobility (R26.89);Muscle weakness (generalized) (M62.81);Pain Pain - Right/Left: (bilateral) Pain - part of body: Leg                Time: 3435-6861 OT Time Calculation (min): 31 min Charges:  OT General Charges $OT Visit: 1 Visit OT Evaluation $OT Eval Moderate Complexity: 1 Mod OT Treatments $Self Care/Home Management : 8-22 mins  Hulda Humphrey OTR/L Acute Rehabilitation Services Pager: [phone] Office: [place] 12/28/2017, 5:18 PM

## 2017-12-28 NOTE — Evaluation (Signed)
Physical Therapy Evaluation Patient Details Name: [name] MRN: [id number] DOB: 24-Jul-1950 Today's Date: 12/28/2017   History of Present Illness  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 PMH of osteoporosis, MVP, psoriatic arthritis.   Clinical Impression  Pt was independent with mobility and ADLs however has had worsening neurologic symptoms over the last 5 weeks, and states that over the last week she has been bed bound. Pt is currently limited in safe mobility by pain in her low back and R gluteal region, decreased LE strength particularly in dorsiflexion, as well as impair sensation in a stocking distribution on bilateral feet with activity and dependent position. Pt is mod I for bed mobility, min guard with transfers and min A for steadying due to R LE hip instability with ambulation in the room. PT recommends SNF level rehab at d/c. PT will continue to follow acutely.     Follow Up Recommendations SNF    Equipment Recommendations  None recommended by PT    Recommendations for Other Services OT consult     Precautions / Restrictions Precautions Precautions: None Restrictions Weight Bearing Restrictions: No      Mobility  Bed Mobility Overal bed mobility: Modified Independent             General bed mobility comments: increased time and use of bed rails   Transfers Overall transfer level: Needs assistance Equipment used: Rolling walker (2 wheeled) Transfers: Sit to/from Stand Sit to Stand: Min guard         General transfer comment: min guard for safety, vc for hand placement with RW  Ambulation/Gait Ambulation/Gait assistance: Min assist Gait Distance (Feet): 15 Feet Assistive device: Rolling walker (2 wheeled) Gait Pattern/deviations: Step-through  pattern;Decreased dorsiflexion - right;Shuffle;Trunk flexed;Decreased weight shift to right Gait velocity: slowed Gait velocity interpretation: <1.31 ft/sec, indicative of household ambulator General Gait Details: min A for steadying, increased R foot drop and mild hip instability with distance/fatigue, c/o of increased pain and paresthesia      Balance Overall balance assessment: Needs assistance Sitting-balance support: Feet supported;No upper extremity supported Sitting balance-Leahy Scale: Good     Standing balance support: Bilateral upper extremity supported Standing balance-Leahy Scale: Poor Standing balance comment: reliant on RW                             Pertinent Vitals/Pain Pain Assessment: 0-10 Pain Score: 3  Pain Location: R >L gluteal, paraethesia R foot, coccxygeal region  Pain Descriptors / Indicators: Numbness;Radiating;Pins and needles;Tingling Pain Intervention(s): Limited activity within patient's tolerance;Monitored during session;Patient requesting pain meds-RN notified    Home Living Family/patient expects to be discharged to:: Private residence Living Arrangements: Spouse/significant other Available Help at Discharge: Family;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) Type of Home: House Home Access: Ramped entrance     Home Layout: One level Home Equipment: Bowman - 2 wheels;Wheelchair - manual;Hand held shower head;Grab bars - tub/shower;Bedside commode;Cane - single point      Prior Function Level of Independence: Independent with assistive device(s)         Comments: 5 weeks ago independent, 3 weeks ago using cane only able to ambulate 5 feet, last week in bed using bed pan  Extremity/Trunk Assessment   Upper Extremity Assessment Upper Extremity Assessment: Overall WFL for tasks assessed    Lower Extremity Assessment Lower Extremity Assessment: RLE deficits/detail;LLE deficits/detail RLE Deficits / Details:  AROM of hip and knee WFL, ankle lacks dorsiflexion, plantar flexion WFL, hip flex 3/5, knee flex 3/5, knee ext 3+/5, ankle dorsi 2/5, plantar 3/5, decreased sensation of entire LE in comparison with L LE RLE Sensation: decreased light touch(stocking distribution of paresthesia with activity/dependent) RLE Coordination: WNL LLE Deficits / Details: AROM WFL, strength grossly assessed 4/5 LLE Sensation: decreased light touch(stocking paresthesia to min calf onset with activity/depende) LLE Coordination: WNL    Cervical / Trunk Assessment Cervical / Trunk Assessment: Normal  Communication   Communication: No difficulties  Cognition Arousal/Alertness: Awake/alert Behavior During Therapy: WFL for tasks assessed/performed Overall Cognitive Status: Within Functional Limits for tasks assessed                                        General Comments General comments (skin integrity, edema, etc.): Pt retired from Therapist, occupational. Husband present throughout session      Assessment/Plan    PT Assessment Patient needs continued PT services  PT Problem List Decreased strength;Decreased range of motion;Decreased activity tolerance;Decreased balance;Decreased mobility;Impaired sensation;Pain       PT Treatment Interventions DME instruction;Gait training;Functional mobility training;Therapeutic activities;Therapeutic exercise;Balance training;Patient/family education    PT Goals (Current goals can be found in the Care Plan section)  Acute Rehab PT Goals Patient Stated Goal: have less pain and symptoms to be able to move PT Goal Formulation: With patient/family Time For Goal Achievement: 01/11/18 Potential to Achieve Goals: Fair    Frequency Min 2X/week   Barriers to discharge Decreased caregiver support      AM-PAC PT "6 Clicks" Daily Activity  Outcome Measure Difficulty turning over in bed (including adjusting bedclothes, sheets and blankets)?: Unable Difficulty moving  from lying on back to sitting on the side of the bed? : Unable Difficulty sitting down on and standing up from a chair with arms (e.g., wheelchair, bedside commode, etc,.)?: Unable Help needed moving to and from a bed to chair (including a wheelchair)?: A Little Help needed walking in hospital room?: A Little Help needed climbing 3-5 steps with a railing? : Total 6 Click Score: 10    End of Session Equipment Utilized During Treatment: Gait belt Activity Tolerance: Patient tolerated treatment well Patient left: in chair;with call bell/phone within reach;with chair alarm set;with family/visitor present Nurse Communication: Mobility status PT Visit Diagnosis: Unsteadiness on feet (R26.81);Other abnormalities of gait and mobility (R26.89);Muscle weakness (generalized) (M62.81);Difficulty in walking, not elsewhere classified (R26.2);Other symptoms and signs involving the nervous system (R29.898);Pain Pain - Right/Left: Right Pain - part of body: Leg(lower back )    Time: 0258-5277 PT Time Calculation (min) (ACUTE ONLY): 36 min   Charges:   PT Evaluation $PT Eval Moderate Complexity: 1 Mod PT Treatments $Gait Training: 8-22 mins        Nathasha Fiorillo B. Migdalia Dk PT, DPT Acute Rehabilitation Services Pager [phone] Office [phone]   [place] 12/28/2017, 11:33 AM

## 2017-12-28 NOTE — Plan of Care (Signed)

## 2017-12-28 NOTE — Progress Notes (Signed)
Patient arrived to unit from ED. Patient is A&O x4. Getting settled in and will follow up on pain.

## 2017-12-28 NOTE — Plan of Care (Signed)
  Problem: Education: Goal: Knowledge of General Education information will improve Description Including pain rating scale, medication(s)/side effects and non-pharmacologic comfort measures Outcome: Progressing   Problem: Clinical Measurements: Goal: Ability to maintain clinical measurements within normal limits will improve Outcome: Progressing Goal: Respiratory complications will improve Outcome: Progressing Goal: Cardiovascular complication will be avoided Outcome: Progressing   Problem: Nutrition: Goal: Adequate nutrition will be maintained Outcome: Progressing   Problem: Coping: Goal: Level of anxiety will decrease Outcome: Progressing   Problem: Elimination: Goal: Will not experience complications related to bowel motility Outcome: Progressing   Problem: Pain Managment: Goal: General experience of comfort will improve Outcome: Progressing   Problem: Safety: Goal: Ability to remain free from injury will improve Outcome: Progressing   Problem: Skin Integrity: Goal: Risk for impaired skin integrity will decrease Outcome: Progressing

## 2017-12-28 NOTE — NC FL2 (Signed)
[place] LEVEL OF CARE SCREENING TOOL     IDENTIFICATION  Patient Name: [name] Birthdate: 20-Dec-1950 Sex: female Admission Date (Current Location): 12/27/2017  [facility] and [place] Number:  [employer] and Address:  [facility], [address] [place], [place] [address]      Provider Number: [id number]  Attending Physician Name and Address:  Lucious Groves, DO  Relative Name and Phone Number:  Ander Purpura (spouse) [phone]    Current Level of Care: Hospital Recommended Level of Care: [place] Prior Approval Number:    Date Approved/Denied:   PASRR Number: [id number]  Discharge Plan: SNF    Current Diagnoses: Patient Active Problem List   Diagnosis Date Noted  . UTI (urinary tract infection) 12/28/2017  . Microscopic hematuria 12/28/2017  . Normocytic anemia 12/28/2017  . Osteoporosis 12/28/2017  . Sacral insufficiency fracture with delayed healing 12/27/2017  . 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 Essential hypertension 11/21/2014  . Asthma 11/12/2014  . Allergic rhinoconjunctivitis 11/12/2014  . GERD (gastroesophageal reflux disease) 11/12/2014    Orientation RESPIRATION BLADDER Height & Weight     Self, Time, Situation, Place  Normal Continent Weight: 145 lb (65.8 kg) Height:  5\' 1"  (154.9 cm)  BEHAVIORAL SYMPTOMS/MOOD NEUROLOGICAL BOWEL NUTRITION STATUS      Continent Diet(see discharge summary)  AMBULATORY STATUS COMMUNICATION OF NEEDS Skin   Limited Assist Verbally Normal                       Personal Care Assistance Level of Assistance  Bathing, Feeding, Dressing, Total care Bathing Assistance: Limited assistance Feeding assistance: Independent Dressing Assistance: Limited assistance Total Care Assistance: Limited assistance   Functional Limitations Info  Sight, Hearing, Speech Sight Info: Adequate Hearing Info: Adequate Speech Info: Adequate    SPECIAL CARE  FACTORS FREQUENCY  PT (By licensed PT), OT (By licensed OT)     PT Frequency: min 2x weekly OT Frequency: min 2x weekly            Contractures Contractures Info: Not present    Additional Factors Info  Code Status, Allergies Code Status Info: full Allergies Info: no known           Current Medications (12/28/2017):  This is the current hospital active medication list Current Facility-Administered Medications  Medication Dose Route Frequency Provider Last Rate Last Dose  . acetaminophen (TYLENOL) tablet 650 mg  650 mg Oral Q4H PRN Masoudi, Elhamalsadat, MD   650 mg at 12/27/17 2157  . albuterol (PROVENTIL) (2.5 MG/3ML) 0.083% nebulizer solution 2.5 mg  2.5 mg Nebulization Q6H PRN Katherine Roan, MD      . amLODipine (NORVASC) tablet 5 mg  5 mg Oral QPM Joni Reining C, DO   5 mg at 12/27/17 2157   And  . benazepril (LOTENSIN) tablet 40 mg  40 mg Oral QPM Joni Reining C, DO   40 mg at 12/27/17 2157  . busPIRone (BUSPAR) tablet 30 mg  30 mg Oral Daily PRN Katherine Roan, MD      . cefTRIAXone (ROCEPHIN) 1 g in sodium chloride 0.9 % 100 mL IVPB  1 g Intravenous Q24H [name], MD 200 mL/hr at 12/28/17 1554 1 g at 12/28/17 1554  . enoxaparin (LOVENOX) injection 40 mg  40 mg Subcutaneous Q24H Katherine Roan, MD   40 mg at 12/27/17 2157  . famotidine (PEPCID) tablet 20 mg  20 mg  Oral Daily PRN Katherine Roan, MD      . feeding supplement (ENSURE ENLIVE) (ENSURE ENLIVE) liquid 237 mL  237 mL Oral BID BM Joni Reining C, DO   237 mL at 12/28/17 0950  . FLUoxetine (PROZAC) capsule 40 mg  40 mg Oral Daily Katherine Roan, MD   40 mg at 12/28/17 0946  . fluticasone furoate-vilanterol (BREO ELLIPTA) 200-25 MCG/INH 1 puff  1 puff Inhalation Daily PRN Katherine Roan, MD      . lactated ringers infusion   Intravenous Continuous [name], MD 100 mL/hr at 12/28/17 1545    . loratadine (CLARITIN) tablet 10 mg  10 mg Oral Daily Katherine Roan, MD   10 mg at  12/27/17 2157  . montelukast (SINGULAIR) tablet 10 mg  10 mg Oral q1800 Katherine Roan, MD      . oxyCODONE (Oxy IR/ROXICODONE) immediate release tablet 5 mg  5 mg Oral Q6H PRN Masoudi, Elhamalsadat, MD   5 mg at 12/28/17 1400  . polyethylene glycol (MIRALAX / GLYCOLAX) packet 17 g  17 g Oral Daily Guadlupe Spanish B, MD      . pravastatin (PRAVACHOL) tablet 40 mg  40 mg Oral QHS Katherine Roan, MD   40 mg at 12/27/17 2157     Discharge Medications: Please see discharge summary for a list of discharge medications.  Relevant Imaging Results:  Relevant Lab Results:   Additional Information SSN: [id number]  Alberteen Sam, LCSW

## 2017-12-28 NOTE — Progress Notes (Signed)
   Subjective: [name] reports continued right lower extremity weakness with sacral and right buttocks pain.  She states that she feels as though her symptoms have been worsening since she was diagnosed with bilateral sacral insufficiency fractures.  She also states that she presented to [facility] 1 month ago with dysuria and fevers. She denies any urinary retention. She was treated with Rocephin at this time and was discharged with an unknown antibiotic.  She states that her dysuria had improved but she continues to have weakness and subjective fevers at home.    Objective:  Vital signs in last 24 hours: Vitals:   12/27/17 1916 12/27/17 2009 12/27/17 2157 12/28/17 1426  BP: 136/61 (!) 159/67 140/67 125/72  Pulse: 86 80  95  Resp: 18 17  18   Temp: 98.2 F (36.8 C) 98 F (36.7 C)  99.1 F (37.3 C)  TempSrc: Oral Oral  Oral  SpO2: 99% 100%  100%  Weight:      Height:       Physical Exam  Constitutional: She appears well-developed and well-nourished.  HENT:  Head: Normocephalic and atraumatic.  Eyes: EOM are normal.  Neck: Normal range of motion.  Cardiovascular: Normal rate, regular rhythm and normal heart sounds.  Pulmonary/Chest: Effort normal and breath sounds normal.  Abdominal: Soft. Bowel sounds are normal. She exhibits no distension. There is no tenderness.  Musculoskeletal: She exhibits no edema or tenderness.  Neurological: She is alert.  4/5 right lower extremity weakness. 5/5 strength of the left lower extremity.  Absent right patellar reflex.  Appropriate left patellar reflex.  Sensation grossly intact.  Skin: Skin is warm and dry.  Psychiatric: She has a normal mood and affect. Her behavior is normal.  Nursing note and vitals reviewed.   Assessment/Plan:  Principal Problem:   UTI (urinary tract infection) Active Problems:   Sacral insufficiency fracture with delayed healing   Microscopic hematuria   Normocytic anemia   Osteoporosis  [name] presented with right lower extremity weakness and sacral pain most consistent with bilateral sacral insufficiency fractures seen on previous MRI.  She was also found to have a urinalysis consistent with urinary tract infection (leukocytes, hematuria, rare bacteria) which may be contributing to her weakness.  It is unclear whether she was appropriately treated with the unknown outpatient antibiotic.  Urine culture ordered and we will start her on ceftriaxone 1 g daily.   Urinary tract infection: 1.  Started ceftriaxone 1 g daily 2.  Follow-up urine culture 3.  Started IV fluids  Bilateral sacral insufficiency fractures: 1.  Continue Tylenol 650 mg q 4 h PRN for mild to moderate pain 2.  Continue oxycodone IR 5 mg q6h PRN for moderate pain  3.  Continue PT/OT  Asthma: Stable and asymptomatic.  No wheezes on exam 1. Continue with albuterol as needed, BREO, loratadine, Singulair  HTN:  Blood pressure today within normal limits 1.  Continue home amlodipine-benazepril 5-40 mg daily  Dispo: Anticipated discharge in approximately 2-3 days.   [name], MD 12/28/2017, 4:46 PM Pager: [phone]

## 2017-12-28 NOTE — Progress Notes (Signed)
Initial Nutrition Assessment  DOCUMENTATION CODES:   Not applicable  INTERVENTION:  -Continue Ensure Enlive po BID, each supplement provides 350 kcal and 20 grams of protein -MVI   NUTRITION DIAGNOSIS:   Inadequate oral intake related to wound healing(sacral insufficiency fracture/stenosis of L4/L5) as evidenced by energy intake < or equal to 75% for > or equal to 1 month, per patient/family report.   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 Worsening neurologic symptoms x 5weeks. PMH: HTN, Depresion, GERD, Abdominal Hysterectomy   Carroll reclined in chair and very welcoming at time of visit. Carroll reports feeling better this afternoon and stated it had to be all up hill from here. Carroll was complimentary to food service and healthcare teams and stated the food has been delightful during LOS. Carroll reports fluctuations in appetite d/t pain levels and losing 10lbs over past 6weeks and stated it was wt she needed to lose in the first place.   Carroll recalls 100% of eggs, biscuit, orange juice, soda, milk for breakfast. Lunch: 100% of Chef salad w/ grilled chix, chocolate pudding. Carroll is enjoying Chocolate EE and reports desire to continue w/ them at home.   Medications: Prozac, Oxycodone Labs: Ca corrects for low albumin Cl 112 (H)   NUTRITION - FOCUSED PHYSICAL EXAM:    Most Recent Value  Orbital Region  Mild depletion  Upper Arm Region  No depletion  Thoracic and Lumbar Region  Unable to assess [d/t broken L4/5 unable to accesss Carroll ]  Buccal Region  No depletion  Temple Region  Mild depletion  Clavicle Bone Region  No depletion  Clavicle and Acromion Bone Region  Unable to assess  Scapular Bone Region  Unable to  assess  Dorsal Hand  No depletion  Patellar Region  Unable to assess  Anterior Thigh Region  Unable to assess  Posterior Calf Region  Unable to assess  Edema (RD Assessment)  None  Hair  Reviewed  Eyes  Reviewed  Mouth  Reviewed  Skin  Reviewed  Nails  Reviewed       Diet Order:   Diet Order            Diet regular Room service appropriate? Yes; Fluid consistency: Thin  Diet effective now              EDUCATION NEEDS:   No education needs have been identified at this time  wSkin:  Skin Assessment: Reviewed RN Assessment  Last BM:  10/23 WDL  Height:   Ht Readings from Last 1 Encounters:  12/27/17 5\' 1"  (1.549 m)    Weight: 145lb  Wt Readings from Last 1 Encounters:  12/27/17 65.8 kg    Ideal Body Weight:  47.7 kg (105lbs)  BMI:  Body mass index is 27.4 kg/m.  Estimated Nutritional Needs:   Kcal:  5277-8242  Protein:  78-92g  Fluid:  1.3-1.6L    Lajuan Lines, RD, LDN  After Hours/Weekend Pager: [phone]

## 2017-12-29 DIAGNOSIS — M8448XG Pathological fracture, other site, subsequent encounter for fracture with delayed healing: Secondary | ICD-10-CM

## 2017-12-29 LAB — URINE CULTURE

## 2017-12-29 MED ORDER — ACETAMINOPHEN 500 MG PO TABS
1000.0000 mg | ORAL_TABLET | Freq: Four times a day (QID) | ORAL | Status: DC | PRN
  Administered 2017-12-29 – 2017-12-30 (×2): 1000 mg via ORAL
  Filled 2017-12-29 (×2): qty 2

## 2017-12-29 MED ORDER — METRONIDAZOLE 500 MG PO TABS
500.0000 mg | ORAL_TABLET | Freq: Three times a day (TID) | ORAL | Status: DC
  Administered 2017-12-29 – 2017-12-30 (×3): 500 mg via ORAL
  Filled 2017-12-29 (×3): qty 1

## 2017-12-29 NOTE — Progress Notes (Signed)
   Subjective: [name] reports no acute complaints.  She denies any dysuria but does say that she has had some abdominal discomfort.  She states that she has been urinating more but believes it is because of the fluids that we gave yesterday.  She continues to have right-sided weakness and coccygeal pain but does not believe that it has worsened.  She denies shortness of breath and chest pain.  Objective:  Vital signs in last 24 hours: Vitals:   12/27/17 2157 12/28/17 1426 12/28/17 1820 12/28/17 1936  BP: 140/67 125/72 125/71 132/71  Pulse:  95 94 93  Resp:  18 18 15   Temp:  99.1 F (37.3 C) 98.9 F (37.2 C) 99 F (37.2 C)  TempSrc:  Oral Oral Oral  SpO2:  100% 94% 97%  Weight:      Height:       Physical Exam  Constitutional: She appears well-developed and well-nourished.  HENT:  Head: Normocephalic and atraumatic.  Eyes: EOM are normal.  Neck: Normal range of motion.  Cardiovascular: Normal rate, regular rhythm and normal heart sounds.  Pulmonary/Chest: Effort normal and breath sounds normal.  Abdominal: Soft. Bowel sounds are normal. She exhibits no distension. There is no tenderness.  Musculoskeletal: She exhibits no edema or tenderness.  Neurological: She is alert.  Bilateral lower extremity weakness (right more than left).  Unchanged from yesterday.  Normal sensation bilaterally.  Skin: Skin is warm and dry.  Psychiatric: She has a normal mood and affect.  Nursing note and vitals reviewed.   Assessment/Plan:  Principal Problem:   UTI (urinary tract infection) Active Problems:   Sacral insufficiency fracture with delayed healing   Microscopic hematuria   Normocytic anemia   Osteoporosis  [name] is being treated for urinary tract infection likely secondary to urinary retention.  She is on day 2 ceftriaxone 1 g.  Urine culture demonstrated multiple species present and suggested recollection.  I do not believe that this is appropriate given that she has  already started ceftriaxone and this will likely confound the results.  We can switch her to an oral antibiotic in the near future.  She will likely be discharged to a SNF tomorrow.  Urinary tract infection: 1.  Continue ceftriaxone 1 g daily  Bilateral sacral insufficiency fractures: 1.  Continue Tylenol 650 mg q 4 h PRN for mild to moderate pain 2.  Continue oxycodone IR 5 mg q6h PRN for moderate pain  3.  Continue PT/OT  Asthma:Stable and asymptomatic.No wheezes on exam 1. Continue with albuterol as needed, BREO,loratadine, Singulair  HTN: Blood pressure today within normal limits 1.  Continue home amlodipine-benazepril 5-40 mg daily  Dispo: Anticipated discharge in approximately 1-2 days. [name], MD 12/29/2017, 6:50 AM Pager: [phone]

## 2017-12-29 NOTE — Care Management Important Message (Signed)
Important Message  Patient Details  Name: [name] MRN: [id number] Date of Birth: 09-Oct-1950   Medicare Important Message Given:  Yes    Hikeem Andersson [address] 12/29/2017, 2:56 PM

## 2017-12-29 NOTE — Progress Notes (Signed)
Pt c/o of right side jaw pain. Area is slightly swollen and tender to touch. Pt requesting "extra strength" tylenol, RN called MD and made her aware and she changed orders. Pt given ice to put on area. Will continue to monitor.

## 2017-12-29 NOTE — Plan of Care (Signed)
  Problem: Education: Goal: Knowledge of General Education information will improve Description Including pain rating scale, medication(s)/side effects and non-pharmacologic comfort measures Outcome: Progressing Note:  POC reviewed with pt.; little anxious; no pain.   Problem: Pain Managment: Goal: General experience of comfort will improve Outcome: Progressing

## 2017-12-30 DIAGNOSIS — K659 Peritonitis, unspecified: Secondary | ICD-10-CM | POA: Diagnosis not present

## 2017-12-30 DIAGNOSIS — K0889 Other specified disorders of teeth and supporting structures: Secondary | ICD-10-CM | POA: Diagnosis not present

## 2017-12-30 DIAGNOSIS — J309 Allergic rhinitis, unspecified: Secondary | ICD-10-CM | POA: Diagnosis not present

## 2017-12-30 DIAGNOSIS — F419 Anxiety disorder, unspecified: Secondary | ICD-10-CM | POA: Diagnosis not present

## 2017-12-30 DIAGNOSIS — K0530 Chronic periodontitis, unspecified: Secondary | ICD-10-CM

## 2017-12-30 DIAGNOSIS — K219 Gastro-esophageal reflux disease without esophagitis: Secondary | ICD-10-CM | POA: Diagnosis not present

## 2017-12-30 DIAGNOSIS — J45909 Unspecified asthma, uncomplicated: Secondary | ICD-10-CM | POA: Diagnosis not present

## 2017-12-30 DIAGNOSIS — R52 Pain, unspecified: Secondary | ICD-10-CM | POA: Diagnosis not present

## 2017-12-30 DIAGNOSIS — M62838 Other muscle spasm: Secondary | ICD-10-CM | POA: Diagnosis not present

## 2017-12-30 DIAGNOSIS — E785 Hyperlipidemia, unspecified: Secondary | ICD-10-CM | POA: Diagnosis not present

## 2017-12-30 DIAGNOSIS — M79641 Pain in right hand: Secondary | ICD-10-CM | POA: Diagnosis not present

## 2017-12-30 DIAGNOSIS — S3210XA Unspecified fracture of sacrum, initial encounter for closed fracture: Secondary | ICD-10-CM | POA: Diagnosis not present

## 2017-12-30 DIAGNOSIS — N390 Urinary tract infection, site not specified: Secondary | ICD-10-CM | POA: Diagnosis not present

## 2017-12-30 DIAGNOSIS — F329 Major depressive disorder, single episode, unspecified: Secondary | ICD-10-CM | POA: Diagnosis not present

## 2017-12-30 DIAGNOSIS — M8448XG Pathological fracture, other site, subsequent encounter for fracture with delayed healing: Secondary | ICD-10-CM | POA: Diagnosis not present

## 2017-12-30 DIAGNOSIS — I10 Essential (primary) hypertension: Secondary | ICD-10-CM | POA: Diagnosis not present

## 2017-12-30 DIAGNOSIS — T782XXD Anaphylactic shock, unspecified, subsequent encounter: Secondary | ICD-10-CM | POA: Diagnosis not present

## 2017-12-30 DIAGNOSIS — M48061 Spinal stenosis, lumbar region without neurogenic claudication: Secondary | ICD-10-CM | POA: Diagnosis not present

## 2017-12-30 DIAGNOSIS — R0602 Shortness of breath: Secondary | ICD-10-CM | POA: Diagnosis not present

## 2017-12-30 DIAGNOSIS — A499 Bacterial infection, unspecified: Secondary | ICD-10-CM | POA: Diagnosis not present

## 2017-12-30 DIAGNOSIS — R339 Retention of urine, unspecified: Secondary | ICD-10-CM | POA: Diagnosis not present

## 2017-12-30 DIAGNOSIS — R062 Wheezing: Secondary | ICD-10-CM | POA: Diagnosis not present

## 2017-12-30 DIAGNOSIS — Z79899 Other long term (current) drug therapy: Secondary | ICD-10-CM | POA: Diagnosis not present

## 2017-12-30 MED ORDER — CEFDINIR 300 MG PO CAPS: 300.0000 mg | ORAL_CAPSULE | Freq: Two times a day (BID) | ORAL | 0 refills | Status: DC

## 2017-12-30 MED ORDER — METRONIDAZOLE 500 MG PO TABS: 500.0000 mg | ORAL_TABLET | Freq: Three times a day (TID) | ORAL | 0 refills | Status: DC

## 2017-12-30 NOTE — Progress Notes (Addendum)
   Subjective: She believes that her right jaw swelling has improved overnight.  She believes that her right lower extremity weakness has also improved since working with physical therapy.  She denies any acute complaints including abdominal pain, shortness of breath, chest pain.  Objective:  Vital signs in last 24 hours: Vitals:   12/29/17 1431 12/29/17 1600 12/29/17 2050 12/30/17 0341  BP: 125/74  125/70 115/62  Pulse: 98  88 79  Resp: 17  16   Temp: 99 F (37.2 C) 98.5 F (36.9 C) 98.7 F (37.1 C) 98 F (36.7 C)  TempSrc: Oral Oral Oral Oral  SpO2: 94%  93% 95%  Weight:      Height:       Physical Exam  Constitutional: She is oriented to person, place, and time. She appears well-developed and well-nourished.  HENT:  Head: Normocephalic and atraumatic.  Eyes: EOM are normal.  Neck: Normal range of motion.  Cardiovascular: Normal rate, regular rhythm and normal heart sounds.  Pulmonary/Chest: Effort normal and breath sounds normal.  Abdominal: Soft. Bowel sounds are normal.  Musculoskeletal: She exhibits no edema or tenderness.  4 out of 5 right lower extremity strength.  5 out of 5 strength of the left lower extremity.  Neurological: She is alert and oriented to person, place, and time.  Skin: Skin is warm and dry.  Psychiatric: She has a normal mood and affect. Her behavior is normal.  Nursing note and vitals reviewed.   Assessment/Plan:  Principal Problem:   UTI (urinary tract infection) Active Problems:   Sacral insufficiency fracture with delayed healing   Microscopic hematuria   Normocytic anemia   Osteoporosis  Heather Heather is being treated for urinary tract infection likely secondary to urinary retention. She is on day 3 ceftriaxone 1 g.Metronidazole was added yesterday for possible tooth infection. Urine culture demonstrated multiple species present.  She is in stable condition will be discharged today to skilled nursing facility.  We will discharge her  today on 2 more days of oral Omnicef and metronidazole.  Urinary tract infection: 1.  Switch her to oral Omnicef and continue 2 more days of this antibiotic as well as 2 more days of metronidazole.  Bilateral sacral insufficiency fractures: 1.Continue PT/OT at SNF 2. Continue Tylenol 650 mg q 4 h PRN for mild to moderate pain  Asthma:Stable and asymptomatic.No wheezes on exam 1.Continue with albuterol as needed,BREO,loratadine, Singulair   IPJ:ASNKN pressure today within normal limits 1.Continue home amlodipine-benazepril 5-40 mg daily  Dispo: Anticipated discharge in approximately 1-2 days.  Heather Sage, MD 12/30/2017, 6:43 AM Pager: [phone]

## 2017-12-30 NOTE — Discharge Instructions (Signed)
Thank you so much for allowing [place] to care for you.  You are found to have a urinary tract infection and have been treated with 2 days of antibiotics.  We also think that you may have an early dental infection.  We would like you to take 2 more days of Cefdinir and Metronidazole to treat both infections.  Please see medication list for instructions.

## 2017-12-30 NOTE — Discharge Summary (Signed)
Name: [name] MRN: [id number] DOB: 1950-09-19 67 y.o. PCP: Cyndi Bender, PA-C  Date of Admission: 12/27/2017 12:08 PM Date of Discharge: 12/30/2017 Attending Physician: Larey Dresser MD  Discharge Diagnosis: 1.  Urinary tract infection likely secondary to urinary retention 2.  Bilateral sacral insufficiency fractures 3.  Periodontitis 4.  Chronic asthma 5.  Hypertension  Discharge Medications: Allergies as of 12/30/2017      Reactions   No Known Allergies       Medication List    TAKE these medications   acetaminophen 650 MG CR tablet Commonly known as:  TYLENOL Take 1,300 mg by mouth every 8 (eight) hours as needed for pain.   albuterol 108 (90 Base) MCG/ACT inhaler Commonly known as:  PROVENTIL HFA;VENTOLIN HFA Inhale 2 puffs into the lungs every 6 (six) hours as needed for wheezing or shortness of breath.   albuterol (2.5 MG/3ML) 0.083% nebulizer solution Commonly known as:  PROVENTIL Take 2.5 mg by nebulization every 6 (six) hours as needed (for wheezing/shortness of breath).   amLODipine-benazepril 5-40 MG capsule Commonly known as:  LOTREL Take 1 capsule by mouth every evening.   aspirin EC 81 MG tablet Take 81 mg by mouth daily. What changed:  Another medication with the same name was removed. Continue taking this medication, and follow the directions you see here.   beclomethasone 80 MCG/ACT inhaler Commonly known as:  QVAR Inhale 2 puffs into the lungs daily as needed (for respiratory issues.).   busPIRone 30 MG tablet Commonly known as:  BUSPAR Take 30 mg by mouth daily as needed (for anxiety.).   CALCIUM 500 PO Take 500 mg by mouth 2 (two) times daily.   cefdinir 300 MG capsule Commonly known as:  OMNICEF Take 1 capsule (300 mg total) by mouth 2 (two) times daily.   celecoxib 200 MG capsule Commonly known as:  CELEBREX Take 200 mg by mouth 2 (two) times daily as needed (for pain/inflammation).   denosumab 60 MG/ML Soln  injection Commonly known as:  PROLIA Inject 60 mg into the skin every 6 (six) months.   EPINEPHrine 0.3 mg/0.3 mL Soaj injection Commonly known as:  EPI-PEN Inject 0.3 mg into the muscle daily. For anaphylactic allergic reaction.   fexofenadine 180 MG tablet Commonly known as:  ALLEGRA TAKE 1 TABLET DAILY   Fish Oil 1200 MG Caps Take 2,400 mg by mouth daily at 6 PM.   FLUoxetine 40 MG capsule Commonly known as:  PROZAC Take 40 mg by mouth daily.   fluticasone 50 MCG/ACT nasal spray Commonly known as:  FLONASE Place 1 spray into both nostrils as needed for allergies or rhinitis.   methocarbamol 500 MG tablet Commonly known as:  ROBAXIN Take 1-2 tablets (500-1,000 mg total) by mouth every 6 (six) hours as needed for muscle spasms.   metroNIDAZOLE 500 MG tablet Commonly known as:  FLAGYL Take 1 tablet (500 mg total) by mouth every 8 (eight) hours.   montelukast 10 MG tablet Commonly known as:  SINGULAIR Take 10 mg by mouth daily at 6 PM.   multivitamin with minerals Tabs tablet Take 1 tablet by mouth daily. 1700 (2 gummies)   OLANZapine 5 MG tablet Commonly known as:  ZYPREXA Take 5 mg by mouth at bedtime.   OTEZLA 30 MG Tabs Generic drug:  Apremilast Take 30 mg by mouth daily.   Oxycodone HCl 10 MG Tabs Take 1 tablet (10 mg total) by mouth every 3 (three) hours as needed for breakthrough pain.  pravastatin 40 MG tablet Commonly known as:  PRAVACHOL Take 40 mg by mouth at bedtime.   ranitidine 150 MG tablet Commonly known as:  ZANTAC Take 150 mg by mouth 2 (two) times daily.   SYMBICORT 160-4.5 MCG/ACT inhaler Generic drug:  budesonide-formoterol Inhale 2 puffs into the lungs 2 (two) times daily as needed. For respiratory issue   traMADol 50 MG tablet Commonly known as:  ULTRAM Take 50 mg by mouth 3 (three) times daily as needed for pain.       Disposition and follow-up:   [name] was discharged from [facility] in Stable  condition.  At the hospital follow up visit please address:  1.  She will need to continue 2 days worth of oral antibiotics to treat both the urinary tract infection and likely peri-but on [place].  Please see medication list for instructions.  She will also need to continue physical therapy and occupational therapy for her bilateral sacral insufficiency fractures.  2.  Labs / imaging needed at time of follow-up: None  3.  Pending labs/ test needing follow-up: None  Follow-up Appointments: Contact information for after-discharge care    Destination    [facility]erred SNF .   Service:  Skilled Nursing Contact information: [place] Hospital Course by problem list: 1.  Urinary tract infection likely secondary to urinary retention: [name] presented to [facility] 1 and half weeks ago with fevers up to 103 and was diagnosed with a urinary tract infection.  She was treated with 1 dose of IV Rocephin before being discharged home that same day.  She received 3 additional doses of oral antibiotics.  She states that she continued to have intermittent low-grade fevers at home as well as increased urinary frequency.  Upon presentation to our ED she was found to have a urinalysis consistent with urinary tract infection given microscopic hematuria as well as pyuria and bacteria present.  We believe that she was likely incompletely treated with the first dose of Rocephin.  She was started on IV ceftriaxone and completed 3 days of this.  We will discharge her with Cefdinir 300 mg twice daily for 2 days.   2.  Bilateral sacral insufficiency fractures: Over 1 and half weeks ago [name] was admitted to [facility] where where she had an MRI of her lumbar spine which revealed sacral insufficiency fractures and stenosis at L4-L5 with some nerve root impingement.  Upon admission she noted that her  pain had increased and she felt as though her right leg was more weak and numb.  She was given Tylenol and oxycodone as needed for pain.  Physical therapy and Occupational Therapy was consulted and she underwent several days of therapy. She will need continued she will need continued PT/OT at SNF.  3.  Periodontitis: During [name] admission she was found to have right jaw swelling and pain with poor dentition on exam.  She also had a submandibular lymph node on the same right side.  She was started on metronidazole for anaerobic coverage as well as continued on ceftriaxone (started for urinary tract infection).  She will be discharged with Cefdinir 300 mg twice daily for 2 days and metronidazole 500 mg every 8 hours for 2 days.  She will likely need a dentist appointment in the future.  4.  Chronic asthma: She had a  normal respiratory rate and was saturating well on room air throughout her admission.  No wheezes were appreciated on exam.  Her home albuterol as needed, Breo, loratadine, and Singulair were all continued.  She will need to continue these medications at the SNF.  5.  Hypertension: Blood pressure remained within normal limits on home amlodipine-benazepril 5-40 mg daily.  She will need to continue this medication at the SNF.  Discharge Vitals:   BP 115/62 (BP Location: Left Arm)   Pulse 79   Temp 98 F (36.7 C) (Oral)   Resp 16   Ht 5\' 1"  (1.549 m)   Wt 65.8 kg   SpO2 95%   BMI 27.40 kg/m   Pertinent Labs, Studies, and Procedures:  Urinalysis    Component Value Date/Time   COLORURINE YELLOW 12/27/2017 1634   APPEARANCEUR HAZY (A) 12/27/2017 1634   LABSPEC 1.027 12/27/2017 1634   PHURINE 5.0 12/27/2017 1634   GLUCOSEU NEGATIVE 12/27/2017 1634   HGBUR SMALL (A) 12/27/2017 1634   BILIRUBINUR NEGATIVE 12/27/2017 1634   KETONESUR NEGATIVE 12/27/2017 1634   PROTEINUR NEGATIVE 12/27/2017 1634   UROBILINOGEN 0.2 10/28/2008 1014   NITRITE NEGATIVE 12/27/2017 1634    LEUKOCYTESUR SMALL (A) 12/27/2017 1634   CBC Latest Ref Rng & Units 12/28/2017 12/27/2017 05/31/2016  WBC 4.0 - 10.5 K/uL 7.7 8.5 17.2(H)  Hemoglobin 12.0 - 15.0 g/dL 10.7(L) 11.9(L) 10.2(L)  Hematocrit 36.0 - 46.0 % 36.3 40.5 32.8(L)  Platelets 150 - 400 K/uL 284 287 280   BMP Latest Ref Rng & Units 12/28/2017 12/27/2017 05/31/2016  Glucose 70 - 99 mg/dL 102(H) 127(H) 169(H)  BUN 8 - 23 mg/dL 18 16 11   Creatinine 0.44 - 1.00 mg/dL 0.72 0.75 0.93  Sodium 135 - 145 mmol/L 141 140 138  Potassium 3.5 - 5.1 mmol/L 3.5 3.1(L) 4.3  Chloride 98 - 111 mmol/L 112(H) 113(H) 106  CO2 22 - 32 mmol/L 22 22 25   Calcium 8.9 - 10.3 mg/dL 8.3(L) 8.5(L) 9.1   Signed: [name], MD 12/30/2017, 12:43 PM   Pager: [phone]

## 2017-12-30 NOTE — Progress Notes (Signed)
Clinical Social Worker facilitated patient discharge including contacting patient family and facility to confirm patient discharge plans.  Clinical information faxed to facility and family agreeable with plan. CSW spoke with patient and patients spouse at bedside, spouse stated he will transport patient to [place].  RN to call [phone] and ask for RN Donny Pique (pt will go in rm# [phone]) for report prior to discharge.  Clinical Social Worker will sign off for now as social work intervention is no longer needed. Please consult [place] again if new need arises.  Rhea Pink, MSW, [place]

## 2017-12-30 NOTE — Progress Notes (Signed)
Discharge instructions completed with pt.  Pt verbalized understanding of the information.  Pt denies chest pain, shortness of breath, dizziness, lightheadedness, and n/v.  Pt discharged to SNF.

## 2017-12-30 NOTE — Plan of Care (Signed)

## 2017-12-30 NOTE — Clinical Social Work Placement (Signed)
   CLINICAL SOCIAL WORK PLACEMENT  NOTE  Date:  12/30/2017  Patient Details  Name: [name] MRN: [id number] Date of Birth: 24-Jan-1951  Clinical Social Work is seeking post-discharge placement for this patient at the [place] level of care (*CSW will initial, date and re-position this form in  chart as items are completed):  Yes   Patient/family provided with [place] Work Department's list of facilities offering this level of care within the geographic area requested by the patient (or if unable, by the patient's family).  Yes   Patient/family informed of their freedom to choose among providers that offer the needed level of care, that participate in Medicare, Medicaid or managed care program needed by the patient, have an available bed and are willing to accept the patient.  Yes   Patient/family informed of [facility]'s ownership interest in [facility] and [facility], as well as of the fact that they are under no obligation to receive care at these facilities.  PASRR submitted to EDS on       PASRR number received on       Existing PASRR number confirmed on 12/30/17     FL2 transmitted to all facilities in geographic area requested by pt/family on 12/30/17     FL2 transmitted to all facilities within larger geographic area on       Patient informed that his/her managed care company has contracts with or will negotiate with certain facilities, including the following:            Patient/family informed of bed offers received.  Patient chooses bed at [place] [place]     Physician recommends and patient chooses bed at      Patient to be transferred to [facility] on 12/30/17.  Patient to be transferred to facility by personal vehicle      Patient family notified on 12/30/17 of transfer.  Name of family member notified:  patient and spouse aware     PHYSICIAN       Additional Comment:     _______________________________________________ [place], [place] 12/30/2017, 2:12 PM

## 2018-01-05 DIAGNOSIS — I10 Essential (primary) hypertension: Secondary | ICD-10-CM | POA: Diagnosis not present

## 2018-01-05 DIAGNOSIS — N390 Urinary tract infection, site not specified: Secondary | ICD-10-CM | POA: Diagnosis not present

## 2018-01-05 DIAGNOSIS — J45909 Unspecified asthma, uncomplicated: Secondary | ICD-10-CM | POA: Diagnosis not present

## 2018-01-05 DIAGNOSIS — K659 Peritonitis, unspecified: Secondary | ICD-10-CM | POA: Diagnosis not present

## 2018-01-05 DIAGNOSIS — S3210XA Unspecified fracture of sacrum, initial encounter for closed fracture: Secondary | ICD-10-CM | POA: Diagnosis not present

## 2018-01-23 DIAGNOSIS — S3219XA Other fracture of sacrum, initial encounter for closed fracture: Secondary | ICD-10-CM | POA: Diagnosis not present

## 2018-01-23 DIAGNOSIS — G544 Lumbosacral root disorders, not elsewhere classified: Secondary | ICD-10-CM | POA: Diagnosis not present

## 2018-01-23 DIAGNOSIS — M5416 Radiculopathy, lumbar region: Secondary | ICD-10-CM | POA: Diagnosis not present

## 2018-01-23 DIAGNOSIS — M25551 Pain in right hip: Secondary | ICD-10-CM | POA: Diagnosis not present

## 2018-01-23 DIAGNOSIS — M1611 Unilateral primary osteoarthritis, right hip: Secondary | ICD-10-CM | POA: Diagnosis not present

## 2018-02-21 DIAGNOSIS — M25551 Pain in right hip: Secondary | ICD-10-CM | POA: Diagnosis not present

## 2018-02-21 DIAGNOSIS — M4316 Spondylolisthesis, lumbar region: Secondary | ICD-10-CM | POA: Diagnosis not present

## 2018-02-21 DIAGNOSIS — M21371 Foot drop, right foot: Secondary | ICD-10-CM | POA: Diagnosis not present

## 2018-02-21 DIAGNOSIS — N200 Calculus of kidney: Secondary | ICD-10-CM | POA: Insufficient documentation

## 2018-02-21 DIAGNOSIS — M5416 Radiculopathy, lumbar region: Secondary | ICD-10-CM | POA: Diagnosis not present

## 2018-02-21 DIAGNOSIS — M2578 Osteophyte, vertebrae: Secondary | ICD-10-CM | POA: Diagnosis not present

## 2018-03-01 DIAGNOSIS — Z6831 Body mass index (BMI) 31.0-31.9, adult: Secondary | ICD-10-CM | POA: Diagnosis not present

## 2018-03-01 DIAGNOSIS — R0781 Pleurodynia: Secondary | ICD-10-CM | POA: Diagnosis not present

## 2018-03-03 DIAGNOSIS — M5416 Radiculopathy, lumbar region: Secondary | ICD-10-CM | POA: Diagnosis not present

## 2018-03-03 DIAGNOSIS — M21371 Foot drop, right foot: Secondary | ICD-10-CM | POA: Diagnosis not present

## 2018-03-03 DIAGNOSIS — M545 Low back pain: Secondary | ICD-10-CM | POA: Diagnosis not present

## 2018-03-07 HISTORY — PX: EYE SURGERY: SHX253

## 2018-03-13 DIAGNOSIS — M21371 Foot drop, right foot: Secondary | ICD-10-CM | POA: Diagnosis not present

## 2018-03-13 DIAGNOSIS — M5416 Radiculopathy, lumbar region: Secondary | ICD-10-CM | POA: Diagnosis not present

## 2018-04-19 DIAGNOSIS — E119 Type 2 diabetes mellitus without complications: Secondary | ICD-10-CM | POA: Diagnosis not present

## 2018-04-19 DIAGNOSIS — E782 Mixed hyperlipidemia: Secondary | ICD-10-CM | POA: Diagnosis not present

## 2018-04-19 DIAGNOSIS — M810 Age-related osteoporosis without current pathological fracture: Secondary | ICD-10-CM | POA: Diagnosis not present

## 2018-04-19 DIAGNOSIS — Z1331 Encounter for screening for depression: Secondary | ICD-10-CM | POA: Diagnosis not present

## 2018-04-19 DIAGNOSIS — I10 Essential (primary) hypertension: Secondary | ICD-10-CM | POA: Diagnosis not present

## 2018-04-25 DIAGNOSIS — J019 Acute sinusitis, unspecified: Secondary | ICD-10-CM | POA: Diagnosis not present

## 2018-04-25 DIAGNOSIS — Z6832 Body mass index (BMI) 32.0-32.9, adult: Secondary | ICD-10-CM | POA: Diagnosis not present

## 2018-05-07 DIAGNOSIS — H43393 Other vitreous opacities, bilateral: Secondary | ICD-10-CM | POA: Diagnosis not present

## 2018-05-07 DIAGNOSIS — H2513 Age-related nuclear cataract, bilateral: Secondary | ICD-10-CM | POA: Diagnosis not present

## 2018-05-10 DIAGNOSIS — M545 Low back pain: Secondary | ICD-10-CM | POA: Diagnosis not present

## 2018-05-10 DIAGNOSIS — G8929 Other chronic pain: Secondary | ICD-10-CM | POA: Diagnosis not present

## 2018-05-10 DIAGNOSIS — M47816 Spondylosis without myelopathy or radiculopathy, lumbar region: Secondary | ICD-10-CM | POA: Diagnosis not present

## 2018-05-10 DIAGNOSIS — M8448XG Pathological fracture, other site, subsequent encounter for fracture with delayed healing: Secondary | ICD-10-CM | POA: Diagnosis not present

## 2018-05-18 DIAGNOSIS — M47816 Spondylosis without myelopathy or radiculopathy, lumbar region: Secondary | ICD-10-CM | POA: Diagnosis not present

## 2018-05-30 DIAGNOSIS — M810 Age-related osteoporosis without current pathological fracture: Secondary | ICD-10-CM | POA: Diagnosis not present

## 2018-06-06 DIAGNOSIS — F3181 Bipolar II disorder: Secondary | ICD-10-CM | POA: Diagnosis not present

## 2018-06-11 DIAGNOSIS — M6281 Muscle weakness (generalized): Secondary | ICD-10-CM | POA: Diagnosis not present

## 2018-06-11 DIAGNOSIS — R29898 Other symptoms and signs involving the musculoskeletal system: Secondary | ICD-10-CM | POA: Diagnosis not present

## 2018-06-11 DIAGNOSIS — M545 Low back pain: Secondary | ICD-10-CM | POA: Diagnosis not present

## 2018-06-11 DIAGNOSIS — G8929 Other chronic pain: Secondary | ICD-10-CM | POA: Diagnosis not present

## 2018-06-11 DIAGNOSIS — M47816 Spondylosis without myelopathy or radiculopathy, lumbar region: Secondary | ICD-10-CM | POA: Diagnosis not present

## 2018-07-13 DIAGNOSIS — M47816 Spondylosis without myelopathy or radiculopathy, lumbar region: Secondary | ICD-10-CM | POA: Diagnosis not present

## 2018-07-16 DIAGNOSIS — G8929 Other chronic pain: Secondary | ICD-10-CM | POA: Diagnosis not present

## 2018-07-16 DIAGNOSIS — M6281 Muscle weakness (generalized): Secondary | ICD-10-CM | POA: Diagnosis not present

## 2018-07-16 DIAGNOSIS — M545 Low back pain: Secondary | ICD-10-CM | POA: Diagnosis not present

## 2018-07-16 DIAGNOSIS — R29898 Other symptoms and signs involving the musculoskeletal system: Secondary | ICD-10-CM | POA: Diagnosis not present

## 2018-07-16 DIAGNOSIS — M47816 Spondylosis without myelopathy or radiculopathy, lumbar region: Secondary | ICD-10-CM | POA: Diagnosis not present

## 2018-07-23 DIAGNOSIS — G8929 Other chronic pain: Secondary | ICD-10-CM | POA: Diagnosis not present

## 2018-07-23 DIAGNOSIS — M47816 Spondylosis without myelopathy or radiculopathy, lumbar region: Secondary | ICD-10-CM | POA: Diagnosis not present

## 2018-07-23 DIAGNOSIS — M6281 Muscle weakness (generalized): Secondary | ICD-10-CM | POA: Diagnosis not present

## 2018-07-23 DIAGNOSIS — R29898 Other symptoms and signs involving the musculoskeletal system: Secondary | ICD-10-CM | POA: Diagnosis not present

## 2018-07-23 DIAGNOSIS — M545 Low back pain: Secondary | ICD-10-CM | POA: Diagnosis not present

## 2018-07-26 DIAGNOSIS — R29898 Other symptoms and signs involving the musculoskeletal system: Secondary | ICD-10-CM | POA: Diagnosis not present

## 2018-07-26 DIAGNOSIS — M6281 Muscle weakness (generalized): Secondary | ICD-10-CM | POA: Diagnosis not present

## 2018-07-26 DIAGNOSIS — M545 Low back pain: Secondary | ICD-10-CM | POA: Diagnosis not present

## 2018-07-26 DIAGNOSIS — M47816 Spondylosis without myelopathy or radiculopathy, lumbar region: Secondary | ICD-10-CM | POA: Diagnosis not present

## 2018-07-26 DIAGNOSIS — G8929 Other chronic pain: Secondary | ICD-10-CM | POA: Diagnosis not present

## 2018-07-31 DIAGNOSIS — R29898 Other symptoms and signs involving the musculoskeletal system: Secondary | ICD-10-CM | POA: Diagnosis not present

## 2018-07-31 DIAGNOSIS — G8929 Other chronic pain: Secondary | ICD-10-CM | POA: Diagnosis not present

## 2018-07-31 DIAGNOSIS — M47816 Spondylosis without myelopathy or radiculopathy, lumbar region: Secondary | ICD-10-CM | POA: Diagnosis not present

## 2018-07-31 DIAGNOSIS — M545 Low back pain: Secondary | ICD-10-CM | POA: Diagnosis not present

## 2018-07-31 DIAGNOSIS — M6281 Muscle weakness (generalized): Secondary | ICD-10-CM | POA: Diagnosis not present

## 2018-08-02 DIAGNOSIS — M6281 Muscle weakness (generalized): Secondary | ICD-10-CM | POA: Diagnosis not present

## 2018-08-02 DIAGNOSIS — M545 Low back pain: Secondary | ICD-10-CM | POA: Diagnosis not present

## 2018-08-02 DIAGNOSIS — M47816 Spondylosis without myelopathy or radiculopathy, lumbar region: Secondary | ICD-10-CM | POA: Diagnosis not present

## 2018-08-02 DIAGNOSIS — R29898 Other symptoms and signs involving the musculoskeletal system: Secondary | ICD-10-CM | POA: Diagnosis not present

## 2018-08-02 DIAGNOSIS — G8929 Other chronic pain: Secondary | ICD-10-CM | POA: Diagnosis not present

## 2018-08-03 DIAGNOSIS — M47816 Spondylosis without myelopathy or radiculopathy, lumbar region: Secondary | ICD-10-CM | POA: Diagnosis not present

## 2018-08-06 DIAGNOSIS — M6281 Muscle weakness (generalized): Secondary | ICD-10-CM | POA: Diagnosis not present

## 2018-08-06 DIAGNOSIS — G8929 Other chronic pain: Secondary | ICD-10-CM | POA: Diagnosis not present

## 2018-08-06 DIAGNOSIS — M545 Low back pain: Secondary | ICD-10-CM | POA: Diagnosis not present

## 2018-08-06 DIAGNOSIS — R29898 Other symptoms and signs involving the musculoskeletal system: Secondary | ICD-10-CM | POA: Diagnosis not present

## 2018-08-06 DIAGNOSIS — M47816 Spondylosis without myelopathy or radiculopathy, lumbar region: Secondary | ICD-10-CM | POA: Diagnosis not present

## 2018-08-08 DIAGNOSIS — M545 Low back pain: Secondary | ICD-10-CM | POA: Diagnosis not present

## 2018-08-08 DIAGNOSIS — R29898 Other symptoms and signs involving the musculoskeletal system: Secondary | ICD-10-CM | POA: Diagnosis not present

## 2018-08-08 DIAGNOSIS — M47816 Spondylosis without myelopathy or radiculopathy, lumbar region: Secondary | ICD-10-CM | POA: Diagnosis not present

## 2018-08-08 DIAGNOSIS — M6281 Muscle weakness (generalized): Secondary | ICD-10-CM | POA: Diagnosis not present

## 2018-08-08 DIAGNOSIS — G8929 Other chronic pain: Secondary | ICD-10-CM | POA: Diagnosis not present

## 2018-08-12 DIAGNOSIS — N200 Calculus of kidney: Secondary | ICD-10-CM | POA: Diagnosis not present

## 2018-08-12 DIAGNOSIS — R319 Hematuria, unspecified: Secondary | ICD-10-CM | POA: Diagnosis not present

## 2018-08-12 DIAGNOSIS — R300 Dysuria: Secondary | ICD-10-CM | POA: Diagnosis not present

## 2018-08-12 DIAGNOSIS — N111 Chronic obstructive pyelonephritis: Secondary | ICD-10-CM | POA: Diagnosis not present

## 2018-08-13 DIAGNOSIS — N200 Calculus of kidney: Secondary | ICD-10-CM | POA: Diagnosis not present

## 2018-08-13 DIAGNOSIS — N3020 Other chronic cystitis without hematuria: Secondary | ICD-10-CM | POA: Diagnosis not present

## 2018-08-17 DIAGNOSIS — M5136 Other intervertebral disc degeneration, lumbar region: Secondary | ICD-10-CM | POA: Diagnosis not present

## 2018-08-17 DIAGNOSIS — R310 Gross hematuria: Secondary | ICD-10-CM | POA: Diagnosis not present

## 2018-08-17 DIAGNOSIS — M47816 Spondylosis without myelopathy or radiculopathy, lumbar region: Secondary | ICD-10-CM | POA: Diagnosis not present

## 2018-08-17 DIAGNOSIS — K5730 Diverticulosis of large intestine without perforation or abscess without bleeding: Secondary | ICD-10-CM | POA: Diagnosis not present

## 2018-08-17 DIAGNOSIS — I700 Atherosclerosis of aorta: Secondary | ICD-10-CM | POA: Diagnosis not present

## 2018-08-17 DIAGNOSIS — K5732 Diverticulitis of large intestine without perforation or abscess without bleeding: Secondary | ICD-10-CM | POA: Diagnosis not present

## 2018-08-17 DIAGNOSIS — R100 Acute abdomen: Secondary | ICD-10-CM | POA: Diagnosis not present

## 2018-08-17 DIAGNOSIS — N200 Calculus of kidney: Secondary | ICD-10-CM | POA: Diagnosis not present

## 2018-08-22 DIAGNOSIS — R1032 Left lower quadrant pain: Secondary | ICD-10-CM | POA: Diagnosis not present

## 2018-08-22 DIAGNOSIS — Z6829 Body mass index (BMI) 29.0-29.9, adult: Secondary | ICD-10-CM | POA: Diagnosis not present

## 2018-08-22 DIAGNOSIS — K5732 Diverticulitis of large intestine without perforation or abscess without bleeding: Secondary | ICD-10-CM | POA: Diagnosis not present

## 2018-08-22 DIAGNOSIS — E119 Type 2 diabetes mellitus without complications: Secondary | ICD-10-CM | POA: Diagnosis not present

## 2018-09-10 DIAGNOSIS — M545 Low back pain: Secondary | ICD-10-CM | POA: Diagnosis not present

## 2018-09-10 DIAGNOSIS — M6281 Muscle weakness (generalized): Secondary | ICD-10-CM | POA: Diagnosis not present

## 2018-09-10 DIAGNOSIS — G8929 Other chronic pain: Secondary | ICD-10-CM | POA: Diagnosis not present

## 2018-09-10 DIAGNOSIS — M47816 Spondylosis without myelopathy or radiculopathy, lumbar region: Secondary | ICD-10-CM | POA: Diagnosis not present

## 2018-09-10 DIAGNOSIS — R29898 Other symptoms and signs involving the musculoskeletal system: Secondary | ICD-10-CM | POA: Diagnosis not present

## 2018-09-14 DIAGNOSIS — M47816 Spondylosis without myelopathy or radiculopathy, lumbar region: Secondary | ICD-10-CM | POA: Diagnosis not present

## 2018-09-28 DIAGNOSIS — Z01818 Encounter for other preprocedural examination: Secondary | ICD-10-CM | POA: Diagnosis not present

## 2018-09-28 DIAGNOSIS — H25811 Combined forms of age-related cataract, right eye: Secondary | ICD-10-CM | POA: Diagnosis not present

## 2018-10-16 DIAGNOSIS — J45909 Unspecified asthma, uncomplicated: Secondary | ICD-10-CM | POA: Diagnosis not present

## 2018-10-16 DIAGNOSIS — F418 Other specified anxiety disorders: Secondary | ICD-10-CM | POA: Diagnosis not present

## 2018-10-16 DIAGNOSIS — E7800 Pure hypercholesterolemia, unspecified: Secondary | ICD-10-CM | POA: Diagnosis not present

## 2018-10-16 DIAGNOSIS — I10 Essential (primary) hypertension: Secondary | ICD-10-CM | POA: Diagnosis not present

## 2018-10-16 DIAGNOSIS — H2511 Age-related nuclear cataract, right eye: Secondary | ICD-10-CM | POA: Diagnosis not present

## 2018-10-16 DIAGNOSIS — H25811 Combined forms of age-related cataract, right eye: Secondary | ICD-10-CM | POA: Diagnosis not present

## 2018-10-16 DIAGNOSIS — E1136 Type 2 diabetes mellitus with diabetic cataract: Secondary | ICD-10-CM | POA: Diagnosis not present

## 2018-10-16 DIAGNOSIS — Z79899 Other long term (current) drug therapy: Secondary | ICD-10-CM | POA: Diagnosis not present

## 2018-10-16 DIAGNOSIS — H259 Unspecified age-related cataract: Secondary | ICD-10-CM | POA: Diagnosis not present

## 2018-10-25 DIAGNOSIS — I10 Essential (primary) hypertension: Secondary | ICD-10-CM | POA: Diagnosis not present

## 2018-10-25 DIAGNOSIS — M810 Age-related osteoporosis without current pathological fracture: Secondary | ICD-10-CM | POA: Diagnosis not present

## 2018-10-25 DIAGNOSIS — E119 Type 2 diabetes mellitus without complications: Secondary | ICD-10-CM | POA: Diagnosis not present

## 2018-10-25 DIAGNOSIS — E782 Mixed hyperlipidemia: Secondary | ICD-10-CM | POA: Diagnosis not present

## 2018-10-30 DIAGNOSIS — M47816 Spondylosis without myelopathy or radiculopathy, lumbar region: Secondary | ICD-10-CM | POA: Diagnosis not present

## 2018-10-30 DIAGNOSIS — M8448XG Pathological fracture, other site, subsequent encounter for fracture with delayed healing: Secondary | ICD-10-CM | POA: Diagnosis not present

## 2018-11-02 DIAGNOSIS — M85852 Other specified disorders of bone density and structure, left thigh: Secondary | ICD-10-CM | POA: Diagnosis not present

## 2018-11-02 DIAGNOSIS — Z1231 Encounter for screening mammogram for malignant neoplasm of breast: Secondary | ICD-10-CM | POA: Diagnosis not present

## 2018-11-02 DIAGNOSIS — M810 Age-related osteoporosis without current pathological fracture: Secondary | ICD-10-CM | POA: Diagnosis not present

## 2018-11-13 DIAGNOSIS — J302 Other seasonal allergic rhinitis: Secondary | ICD-10-CM | POA: Diagnosis not present

## 2018-11-13 DIAGNOSIS — E785 Hyperlipidemia, unspecified: Secondary | ICD-10-CM | POA: Diagnosis not present

## 2018-11-13 DIAGNOSIS — Z79899 Other long term (current) drug therapy: Secondary | ICD-10-CM | POA: Diagnosis not present

## 2018-11-13 DIAGNOSIS — K219 Gastro-esophageal reflux disease without esophagitis: Secondary | ICD-10-CM | POA: Diagnosis not present

## 2018-11-13 DIAGNOSIS — E119 Type 2 diabetes mellitus without complications: Secondary | ICD-10-CM | POA: Diagnosis not present

## 2018-11-13 DIAGNOSIS — M1990 Unspecified osteoarthritis, unspecified site: Secondary | ICD-10-CM | POA: Diagnosis not present

## 2018-11-13 DIAGNOSIS — H259 Unspecified age-related cataract: Secondary | ICD-10-CM | POA: Diagnosis not present

## 2018-11-13 DIAGNOSIS — I10 Essential (primary) hypertension: Secondary | ICD-10-CM | POA: Diagnosis not present

## 2018-11-13 DIAGNOSIS — H25812 Combined forms of age-related cataract, left eye: Secondary | ICD-10-CM | POA: Diagnosis not present

## 2018-11-13 DIAGNOSIS — H2512 Age-related nuclear cataract, left eye: Secondary | ICD-10-CM | POA: Diagnosis not present

## 2018-12-06 DIAGNOSIS — M810 Age-related osteoporosis without current pathological fracture: Secondary | ICD-10-CM | POA: Diagnosis not present

## 2018-12-19 DIAGNOSIS — Z23 Encounter for immunization: Secondary | ICD-10-CM | POA: Diagnosis not present

## 2018-12-19 DIAGNOSIS — M810 Age-related osteoporosis without current pathological fracture: Secondary | ICD-10-CM | POA: Diagnosis not present

## 2018-12-28 DIAGNOSIS — G8929 Other chronic pain: Secondary | ICD-10-CM | POA: Diagnosis not present

## 2018-12-28 DIAGNOSIS — M79602 Pain in left arm: Secondary | ICD-10-CM | POA: Diagnosis not present

## 2018-12-28 DIAGNOSIS — M549 Dorsalgia, unspecified: Secondary | ICD-10-CM | POA: Diagnosis not present

## 2018-12-28 DIAGNOSIS — Z6831 Body mass index (BMI) 31.0-31.9, adult: Secondary | ICD-10-CM | POA: Diagnosis not present

## 2019-01-02 DIAGNOSIS — F3181 Bipolar II disorder: Secondary | ICD-10-CM | POA: Diagnosis not present

## 2019-01-14 DIAGNOSIS — N3020 Other chronic cystitis without hematuria: Secondary | ICD-10-CM | POA: Diagnosis not present

## 2019-01-15 DIAGNOSIS — N3090 Cystitis, unspecified without hematuria: Secondary | ICD-10-CM | POA: Diagnosis not present

## 2019-01-25 DIAGNOSIS — N3020 Other chronic cystitis without hematuria: Secondary | ICD-10-CM | POA: Diagnosis not present

## 2019-01-25 DIAGNOSIS — N3090 Cystitis, unspecified without hematuria: Secondary | ICD-10-CM | POA: Diagnosis not present

## 2019-01-26 IMAGING — NM NM BONE 3 PHASE
2 series · 12 of 12 positions shown · non-contrast
Comparison: RIGHT knee radiographs 04/10/2016

CLINICAL DATA: RIGHT patellar pain radiating to mid she in with
swelling and instability for 3 weeks, history of RIGHT total knee
arthroplasty in 4112 and 7900, LEFT total knee arthroplasty 3010,
loose bodies RIGHT knee

EXAM:
NUCLEAR MEDICINE 3-PHASE BONE SCAN
TECHNIQUE: Radionuclide angiographic images, immediate static blood pool
images, and 3-hour delayed static images were obtained of the knees
after intravenous injection of radiopharmaceutical.
RADIOPHARMACEUTICALS:  20.9 mCi 7c-00m MDP IV

[Series 1: flow · 4.14mm/px · 6 of 40 frames shown (1 of 2)]
[frame 4/40]
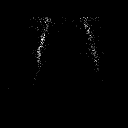
[frame 10/40  full-range]
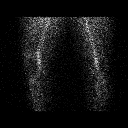
[frame 17/40  full-range]
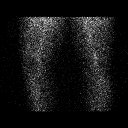
[frame 24/40  full-range]
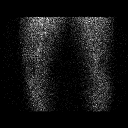
[frame 30/40  full-range]
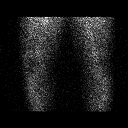
[frame 37/40  full-range]
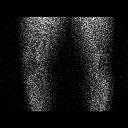

[Series 1: flow · 4.14mm/px · 6 of 40 frames shown (2 of 2)]
[frame 4/40]
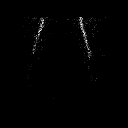
[frame 10/40  full-range]
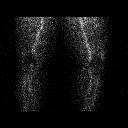
[frame 17/40  full-range]
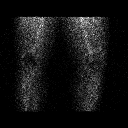
[frame 24/40  full-range]
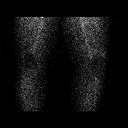
[frame 30/40  full-range]
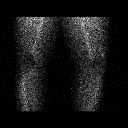
[frame 37/40  full-range]
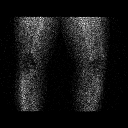

[12 of 12 positions shown; findings below may reference images not displayed]

FINDINGS: Vascular phase: Symmetric blood flow to both knees

Blood pool phase: Minimally increased blood pool surrounding RIGHT
knee versus LEFT

Delayed phase: Photopenic defects at both knees from knee
prostheses. Subtle increased tracer localization at the proximal
RIGHT tibia adjacent to the tibial component of the RIGHT knee
prosthesis, cannot completely exclude prosthetic loosening,
infection less likely.
IMPRESSION: Increased blood pool surrounding RIGHT knee with subtle increased
delayed uptake of tracer adjacent to the tibial component of the
RIGHT knee prosthesis, cannot exclude loosening as above.

## 2019-02-05 DIAGNOSIS — N3020 Other chronic cystitis without hematuria: Secondary | ICD-10-CM | POA: Diagnosis not present

## 2019-02-05 DIAGNOSIS — N3090 Cystitis, unspecified without hematuria: Secondary | ICD-10-CM | POA: Diagnosis not present

## 2019-02-18 DIAGNOSIS — H1011 Acute atopic conjunctivitis, right eye: Secondary | ICD-10-CM | POA: Diagnosis not present

## 2019-02-18 DIAGNOSIS — Z6830 Body mass index (BMI) 30.0-30.9, adult: Secondary | ICD-10-CM | POA: Diagnosis not present

## 2019-03-19 DIAGNOSIS — M5412 Radiculopathy, cervical region: Secondary | ICD-10-CM | POA: Diagnosis not present

## 2019-03-19 DIAGNOSIS — M50223 Other cervical disc displacement at C6-C7 level: Secondary | ICD-10-CM | POA: Diagnosis not present

## 2019-03-27 DIAGNOSIS — Z20822 Contact with and (suspected) exposure to covid-19: Secondary | ICD-10-CM | POA: Diagnosis not present

## 2019-03-27 DIAGNOSIS — J309 Allergic rhinitis, unspecified: Secondary | ICD-10-CM | POA: Diagnosis not present

## 2019-03-27 DIAGNOSIS — J019 Acute sinusitis, unspecified: Secondary | ICD-10-CM | POA: Diagnosis not present

## 2019-04-03 DIAGNOSIS — M4802 Spinal stenosis, cervical region: Secondary | ICD-10-CM | POA: Diagnosis not present

## 2019-04-16 DIAGNOSIS — M810 Age-related osteoporosis without current pathological fracture: Secondary | ICD-10-CM | POA: Diagnosis not present

## 2019-04-16 DIAGNOSIS — M5412 Radiculopathy, cervical region: Secondary | ICD-10-CM | POA: Insufficient documentation

## 2019-05-08 DIAGNOSIS — M5412 Radiculopathy, cervical region: Secondary | ICD-10-CM | POA: Diagnosis not present

## 2019-05-08 DIAGNOSIS — M4802 Spinal stenosis, cervical region: Secondary | ICD-10-CM | POA: Diagnosis not present

## 2019-05-16 DIAGNOSIS — R3915 Urgency of urination: Secondary | ICD-10-CM | POA: Diagnosis not present

## 2019-05-16 DIAGNOSIS — Z6832 Body mass index (BMI) 32.0-32.9, adult: Secondary | ICD-10-CM | POA: Diagnosis not present

## 2019-06-06 DIAGNOSIS — Z79899 Other long term (current) drug therapy: Secondary | ICD-10-CM | POA: Diagnosis not present

## 2019-06-12 DIAGNOSIS — M4802 Spinal stenosis, cervical region: Secondary | ICD-10-CM | POA: Diagnosis not present

## 2019-06-12 DIAGNOSIS — M5090 Cervical disc disorder, unspecified, unspecified cervical region: Secondary | ICD-10-CM | POA: Diagnosis not present

## 2019-06-13 ENCOUNTER — Other Ambulatory Visit: Payer: Self-pay | Admitting: Neurosurgery

## 2019-06-13 DIAGNOSIS — Z471 Aftercare following joint replacement surgery: Secondary | ICD-10-CM | POA: Diagnosis not present

## 2019-06-13 DIAGNOSIS — Z96651 Presence of right artificial knee joint: Secondary | ICD-10-CM | POA: Diagnosis not present

## 2019-06-19 DIAGNOSIS — I10 Essential (primary) hypertension: Secondary | ICD-10-CM | POA: Diagnosis not present

## 2019-06-19 DIAGNOSIS — M810 Age-related osteoporosis without current pathological fracture: Secondary | ICD-10-CM | POA: Diagnosis not present

## 2019-06-19 DIAGNOSIS — E782 Mixed hyperlipidemia: Secondary | ICD-10-CM | POA: Diagnosis not present

## 2019-06-19 DIAGNOSIS — N1831 Chronic kidney disease, stage 3a: Secondary | ICD-10-CM | POA: Diagnosis not present

## 2019-06-19 DIAGNOSIS — I129 Hypertensive chronic kidney disease with stage 1 through stage 4 chronic kidney disease, or unspecified chronic kidney disease: Secondary | ICD-10-CM | POA: Diagnosis not present

## 2019-06-19 DIAGNOSIS — Z139 Encounter for screening, unspecified: Secondary | ICD-10-CM | POA: Diagnosis not present

## 2019-06-19 DIAGNOSIS — E1122 Type 2 diabetes mellitus with diabetic chronic kidney disease: Secondary | ICD-10-CM | POA: Diagnosis not present

## 2019-06-19 DIAGNOSIS — N183 Chronic kidney disease, stage 3 unspecified: Secondary | ICD-10-CM | POA: Diagnosis not present

## 2019-07-01 DIAGNOSIS — M1811 Unilateral primary osteoarthritis of first carpometacarpal joint, right hand: Secondary | ICD-10-CM | POA: Diagnosis not present

## 2019-07-04 DIAGNOSIS — M4802 Spinal stenosis, cervical region: Secondary | ICD-10-CM | POA: Diagnosis not present

## 2019-07-05 ENCOUNTER — Other Ambulatory Visit: Payer: Self-pay | Admitting: Neurosurgery

## 2019-07-15 NOTE — Progress Notes (Addendum)
[facility] for [facility] - [place], [place] [address] Phone: [phone] Fax: [phone]  [facility] [place], [place] [address] [place] [address] Phone: [phone] Fax: [place], [place] [address] Phone: [phone] Fax: [place], [place] [address] [place] [address] Phone: [phone] Fax: [phone]      Your procedure is scheduled on Friday, Jul 19, 2019.  Report to [facility] Main Entrance "A" at 8:40 A.M., and check in at the Admitting office.  Call this number if you have problems the morning of surgery:  [phone]  Call [phone] if you have any questions prior to your surgery date Monday-Friday 8am-4pm    Remember:  Do not eat or drink after midnight the night before your surgery     Take these medicines the morning of surgery with A SIP OF WATER:  fexofenadine (ALLEGRA) Famotidine (PEPCID) Gabapentin (NEURONTIN)   If needed: albuterol (PROVENTIL HFA;VENTOLIN HFA) albuterol (PROVENTIL) busPIRone (BUSPAR) fluticasone (FLONASE)  methocarbamol (ROBAXIN)  Please bring all inhalers with you the day of surgery.    As of today, STOP taking any Aspirin containing products, Aleve, Naproxen, Ibuprofen, Motrin, Advil, Goody's, BC's, all herbal medications, fish oil, and all vitamins.                      Do not wear jewelry, make up, or nail polish            Do not wear lotions, powders, perfumes, or deodorant.            Do not shave 48 hours prior to surgery.            Do not bring valuables to the hospital.            [facility] is not responsible for any belongings or valuables.  Do NOT Smoke (Tobacco/Vapping) or drink Alcohol 24  hours prior to your procedure If you use a CPAP at night, you may bring all equipment for your overnight stay.   Contacts, glasses, dentures or bridgework may not be worn into surgery.      For patients admitted to the hospital, discharge time will be determined by your treatment team.   Patients discharged the day of surgery will not be allowed to drive home, and someone needs to stay with them for 24 hours.    Special instructions:   [place]- Preparing For Surgery  Before surgery, you can play an important role. Because skin is not sterile, your skin needs to be as free of germs as possible. You can reduce the number of germs on your skin by washing with CHG (chlorahexidine gluconate) Soap before surgery.  CHG is an antiseptic cleaner which kills germs and bonds with the skin to continue killing germs even after washing.    Oral Hygiene is also important to reduce your risk of infection.  Remember - BRUSH YOUR TEETH THE MORNING OF SURGERY WITH YOUR REGULAR TOOTHPASTE  Please do not use if you have an allergy to CHG or antibacterial soaps. If your skin becomes reddened/irritated stop using the CHG.  Do not shave (including legs and underarms) for  at least 48 hours prior to first CHG shower. It is OK to shave your face.  Please follow these instructions carefully.   1. Shower the NIGHT BEFORE SURGERY and the MORNING OF SURGERY with CHG Soap.   2. If you chose to wash your hair, wash your hair first as usual with your normal shampoo.  3. After you shampoo, rinse your hair and body thoroughly to remove the shampoo.  4. Use CHG as you would any other liquid soap. You can apply CHG directly to the skin and wash gently with a scrungie or a clean washcloth.   5. Apply the CHG Soap to your body ONLY FROM THE NECK DOWN.  Do not use on open wounds or open sores. Avoid contact with your eyes, ears, mouth and genitals (private parts). Wash Face and genitals (private parts)  with your normal  soap.   6. Wash thoroughly, paying special attention to the area where your surgery will be performed.  7. Thoroughly rinse your body with warm water from the neck down.  8. DO NOT shower/wash with your normal soap after using and rinsing off the CHG Soap.  9. Pat yourself dry with a CLEAN TOWEL.  10. Wear CLEAN PAJAMAS to bed the night before surgery, wear comfortable clothes the morning of surgery  11. Place CLEAN SHEETS on your bed the night of your first shower and DO NOT SLEEP WITH PETS.   Day of Surgery:   Do not apply any deodorants/lotions.  Please wear clean clothes to the hospital/surgery center.   Remember to brush your teeth WITH YOUR REGULAR TOOTHPASTE.   Please read over the following fact sheets that you were given.

## 2019-07-16 ENCOUNTER — Other Ambulatory Visit: Payer: Self-pay

## 2019-07-16 ENCOUNTER — Encounter (HOSPITAL_COMMUNITY)
Admission: RE | Admit: 2019-07-16 | Discharge: 2019-07-16 | Disposition: A | Discharge status: Home / Self Care | Payer: Medicare Other | Source: Ambulatory Visit | Attending: Neurosurgery | Admitting: Neurosurgery

## 2019-07-16 ENCOUNTER — Encounter (HOSPITAL_COMMUNITY): Payer: Self-pay

## 2019-07-16 ENCOUNTER — Other Ambulatory Visit (HOSPITAL_COMMUNITY)
Admission: RE | Admit: 2019-07-16 | Discharge: 2019-07-16 | Disposition: A | Discharge status: Home / Self Care | Payer: Medicare Other | Source: Ambulatory Visit | Attending: Neurosurgery | Admitting: Neurosurgery

## 2019-07-16 DIAGNOSIS — L409 Psoriasis, unspecified: Secondary | ICD-10-CM | POA: Insufficient documentation

## 2019-07-16 DIAGNOSIS — Z79899 Other long term (current) drug therapy: Secondary | ICD-10-CM | POA: Insufficient documentation

## 2019-07-16 DIAGNOSIS — I10 Essential (primary) hypertension: Secondary | ICD-10-CM | POA: Insufficient documentation

## 2019-07-16 DIAGNOSIS — Z01818 Encounter for other preprocedural examination: Secondary | ICD-10-CM | POA: Insufficient documentation

## 2019-07-16 DIAGNOSIS — Z20822 Contact with and (suspected) exposure to covid-19: Secondary | ICD-10-CM | POA: Insufficient documentation

## 2019-07-16 DIAGNOSIS — R7303 Prediabetes: Secondary | ICD-10-CM | POA: Diagnosis not present

## 2019-07-16 DIAGNOSIS — F329 Major depressive disorder, single episode, unspecified: Secondary | ICD-10-CM | POA: Insufficient documentation

## 2019-07-16 DIAGNOSIS — F419 Anxiety disorder, unspecified: Secondary | ICD-10-CM | POA: Insufficient documentation

## 2019-07-16 DIAGNOSIS — L4050 Arthropathic psoriasis, unspecified: Secondary | ICD-10-CM | POA: Diagnosis not present

## 2019-07-16 DIAGNOSIS — J45909 Unspecified asthma, uncomplicated: Secondary | ICD-10-CM | POA: Diagnosis not present

## 2019-07-16 DIAGNOSIS — Z87442 Personal history of urinary calculi: Secondary | ICD-10-CM | POA: Insufficient documentation

## 2019-07-16 DIAGNOSIS — Z96653 Presence of artificial knee joint, bilateral: Secondary | ICD-10-CM | POA: Insufficient documentation

## 2019-07-16 DIAGNOSIS — K219 Gastro-esophageal reflux disease without esophagitis: Secondary | ICD-10-CM | POA: Diagnosis not present

## 2019-07-16 HISTORY — DX: Pneumonia, unspecified organism: J18.9

## 2019-07-16 HISTORY — DX: Prediabetes: R73.03

## 2019-07-16 LAB — BASIC METABOLIC PANEL
Anion gap: 10 (ref 5–15)
BUN: 15 mg/dL (ref 8–23)
CO2: 24 mmol/L (ref 22–32)
Calcium: 9.8 mg/dL (ref 8.9–10.3)
Chloride: 107 mmol/L (ref 98–111)
Creatinine, Ser: 0.83 mg/dL (ref 0.44–1.00)
GFR calc Af Amer: >60 mL/min (ref >60)
GFR calc non Af Amer: >60 mL/min (ref >60)
Glucose, Bld: 84 mg/dL (ref 70–99)
Potassium: 4 mmol/L (ref 3.5–5.1)
Sodium: 141 mmol/L (ref 135–145)

## 2019-07-16 LAB — CBC WITH DIFFERENTIAL/PLATELET
Abs Immature Granulocytes: 0.04 10*3/uL (ref 0.00–0.07)
Basophils Absolute: 0.1 10*3/uL (ref 0.0–0.1)
Basophils Relative: 1 %
Eosinophils Absolute: 0.4 10*3/uL (ref 0.0–0.5)
Eosinophils Relative: 4 %
HCT: 42.1 % (ref 36.0–46.0)
Hemoglobin: 12.8 g/dL (ref 12.0–15.0)
Immature Granulocytes: 0 %
Lymphocytes Relative: 18 %
Lymphs Abs: 1.7 10*3/uL (ref 0.7–4.0)
MCH: 25.8 pg — ABNORMAL LOW (ref 26.0–34.0)
MCHC: 30.4 g/dL (ref 30.0–36.0)
MCV: 84.7 fL (ref 80.0–100.0)
Monocytes Absolute: 0.8 10*3/uL (ref 0.1–1.0)
Monocytes Relative: 9 %
Neutro Abs: 6.1 10*3/uL (ref 1.7–7.7)
Neutrophils Relative %: 68 %
Platelets: 230 10*3/uL (ref 150–400)
RBC: 4.97 MIL/uL (ref 3.87–5.11)
RDW: 14.7 % (ref 11.5–15.5)
WBC: 9 10*3/uL (ref 4.0–10.5)
nRBC: 0 % (ref 0.0–0.2)

## 2019-07-16 LAB — SARS CORONAVIRUS 2 (TAT 6-24 HRS): SARS Coronavirus 2: NEGATIVE

## 2019-07-16 LAB — HEMOGLOBIN A1C
Hgb A1c MFr Bld: 6.6 % — ABNORMAL HIGH (ref 4.8–5.6)
Mean Plasma Glucose: 142.72 mg/dL

## 2019-07-16 LAB — GLUCOSE, CAPILLARY: Glucose-Capillary: 112 mg/dL — ABNORMAL HIGH (ref 70–99)

## 2019-07-16 LAB — SURGICAL PCR SCREEN
MRSA, PCR: NEGATIVE
Staphylococcus aureus: NEGATIVE

## 2019-07-16 NOTE — Progress Notes (Addendum)
PCP - Dr. Daiva Eves Cardiologist - Denies  PPM/ICD - Denies  Chest x-ray - N/A EKG - 07/16/19 Stress Test - Denies ECHO - 11/06/14 Cardiac Cath - Denies   Sleep Study - Denies  Pt states she is pre-diabetic.   Blood Thinner Instructions: N/A Aspirin Instructions: N/A  ERAS Protcol - No  COVID TEST- 07/16/19   Coronavirus Screening  Have you experienced the following symptoms:  Cough yes/no: No Fever (>100.51F)  yes/no: No Runny nose yes/no: No Sore throat yes/no: No Difficulty breathing/shortness of breath  yes/no: No  Have you or a family member traveled in the last 14 days and where? yes/no: No   If the patient indicates "YES" to the above questions, their PAT will be rescheduled to limit the exposure to others and, the surgeon will be notified. THE PATIENT WILL NEED TO BE ASYMPTOMATIC FOR 14 DAYS.   If the patient is not experiencing any of these symptoms, the PAT nurse will instruct them to NOT bring anyone with them to their appointment since they may have these symptoms or traveled as well.   Please remind your patients and families that hospital visitation restrictions are in effect and the importance of the restrictions.     Anesthesia review: Yes, anesthesia comp. In the past  Patient denies shortness of breath, fever, cough and chest pain at PAT appointment   All instructions explained to the patient, with a verbal understanding of the material. Patient agrees to go over the instructions while at home for a better understanding. Patient also instructed to self quarantine after being tested for COVID-19. The opportunity to ask questions was provided.

## 2019-07-17 NOTE — Progress Notes (Signed)
Anesthesia Chart Review:  Case: [id number] Date/Time: 07/19/19 1029   Procedure: ACDF - C4-C5 - C5-C6 - C6-C7 (N/A )   Anesthesia type: General   Pre-op diagnosis: Stenosis   Location: [facility] OR ROOM 25 / [place] OR   Surgeons: Earnie Larsson, MD      DISCUSSION: Patient is a 69 year old female scheduled for the above procedure.  History includes never smoker, postoperative N/V, prolonged emergence, HTN, murmur/MVP (trace-mild MR 2016), asthma, anxiety, depression, GERD, anemia, pre-diabetes, psoriatic arthritis, cancer (SCC, 2002). BMI is consistent with obesity. S/p left TKR 11/03/08 (readmission 11/05/08 for AMS, hypoxia--CTA negative for PE). S/p right TKR 07/16/15, with revision 06/09/16.  S/p Lumbar medial branch block 05/18/18 and 07/13/18 and radiofrequency L3-S1 nerve ablation 08/03/18 and 09/14/18.   She denied shortness of breath, cough, fever, chest pain at PAT RN visit.  07/16/2019 presurgical COVID-19 test was negative.  Anesthesia team to evaluate on the day of surgery determine definitive anesthesia plan.   VS: BP 111/87   Pulse 91   Temp 37 C (Oral)   Resp 18   Ht 5\' 1"  (1.549 m)   Wt 77.9 kg   SpO2 98%   BMI 32.44 kg/m   PROVIDERS: Hamrick, Lorin Mercy, MD his PCP -She is not routinely followed by cardiology, but saw Jenne Campus, MD in 2016-2017 (see [place]) for HTN, diastolic CHF (imparied relaxation on 2016 echo). Continue medical management with BP control recommended.    LABS: Labs reviewed: Acceptable for surgery. (all labs ordered are listed, but only abnormal results are displayed)  Labs Reviewed  GLUCOSE, CAPILLARY - Abnormal; Notable for the following components:      Result Value   Glucose-Capillary 112 (*)    All other components within normal limits  CBC WITH DIFFERENTIAL/PLATELET - Abnormal; Notable for the following components:   MCH 25.8 (*)    All other components within normal limits  HEMOGLOBIN A1C - Abnormal; Notable for the following components:    Hgb A1c MFr Bld 6.6 (*)    All other components within normal limits  SURGICAL PCR SCREEN  BASIC METABOLIC PANEL     IMAGES: MRI C-spine 03/19/19 [facility], report in [facility]/PACS): IMPRESSION: 1. 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 2. Slight flattening of the ventral cord and right worse than left moderately severe to severe foraminal narrowing at C5-6. 3. Mild flattening of the ventral cord and moderate to moderately severe foraminal narrowing at C6-7 is worse on the left. 4. Moderate left foraminal narrowing C7-T1.   EKG: 07/16/19: NSR   CV: Echo 11/06/14 [facility], scanned under Media tab, Correspondence 05/30/16):  Conclusions 1. LV systolic function is hyperdynamic with an estimated EF >70%.  2. Diastolic filling pattern indicates impaired relaxation.  3. LA moderately dilated by volume 4. The mitral valve leaflets are mildly thickened.  Trace to mild MR 5. Trace TR 6. RV systolic pressure is normal at <35 mmHg.    Past Medical History:  Diagnosis Date  . Anemia   . Anxiety   . Arthritis   . Asthma   . Cancer ([place]) 2002   Squamous Carcinoma  . Depression   . GERD (gastroesophageal reflux disease)   . Heart murmur    Mitral Valve prolapse  . History of kidney stones   . Hypertension   . Pneumonia   . PONV (postoperative nausea and vomiting) 2003   Too much anesthesia; hard to  wake up/coma; N/V  . Pre-diabetes     Past Surgical History:  Procedure Laterality Date  . ABDOMINAL HYSTERECTOMY    . EYE SURGERY  2020   Cataract lens replacement  . FRACTURE SURGERY Left 2003   Humerous fracture  . JOINT REPLACEMENT     right and left knee replacement  . kidney stone removed    . TONSILLECTOMY    . TOTAL KNEE REVISION Right 05/30/2016   Procedure: TOTAL KNEE REVISION;  Surgeon: Vickey Huger, MD;  Location: [facility];  Service: Orthopedics;  Laterality: Right;   . WRIST ARTHROPLASTY Left     MEDICATIONS: . albuterol (PROVENTIL HFA;VENTOLIN HFA) 108 (90 BASE) MCG/ACT inhaler  . albuterol (PROVENTIL) (2.5 MG/3ML) 0.083% nebulizer solution  . amLODipine-benazepril (LOTREL) 5-40 MG capsule  . busPIRone (BUSPAR) 30 MG tablet  . Calcium-Magnesium-Vitamin D (CALCIUM 500 PO)  . denosumab (PROLIA) 60 MG/ML SOLN injection  . EPINEPHrine 0.3 mg/0.3 mL IJ SOAJ injection  . famotidine (PEPCID) 40 MG tablet  . fexofenadine (ALLEGRA) 180 MG tablet  . fluticasone (FLONASE) 50 MCG/ACT nasal spray  . gabapentin (NEURONTIN) 300 MG capsule  . methocarbamol (ROBAXIN) 500 MG tablet  . montelukast (SINGULAIR) 10 MG tablet  . OLANZapine (ZYPREXA) 5 MG tablet  . pravastatin (PRAVACHOL) 40 MG tablet   No current facility-administered medications for this encounter.    Myra Gianotti, PA-C Surgical Short Stay/Anesthesiology [facility] Phone [phone] [facility] Phone [phone] 07/17/2019 2:44 PM

## 2019-07-17 NOTE — Anesthesia Preprocedure Evaluation (Addendum)
Anesthesia Evaluation  Patient identified by MRN, date of birth, ID band Patient awake    Reviewed: Allergy & Precautions, NPO status , Patient's Chart, lab work & pertinent test results  History of Anesthesia Complications (+) PONV  Airway Mallampati: IV  TM Distance: >3 FB Neck ROM: Full    Dental no notable dental hx. (+) Dental Advidsory Given   Pulmonary asthma (controlled) ,    Pulmonary exam normal breath sounds clear to auscultation       Cardiovascular hypertension, Pt. on medications Normal cardiovascular exam+ Valvular Problems/Murmurs MVP  Rhythm:Regular Rate:Normal  ECG: NSR, rate 78   Neuro/Psych PSYCHIATRIC DISORDERS Anxiety Depression negative neurological ROS     GI/Hepatic Neg liver ROS, GERD  Medicated and Controlled,  Endo/Other  negative endocrine ROS  Renal/GU negative Renal ROS     Musculoskeletal  (+) Arthritis ,   Abdominal (+) + obese,   Peds  Hematology  (+) Blood dyscrasia, anemia , HLD   Anesthesia Other Findings Cervical Stenosis  Reproductive/Obstetrics                           Anesthesia Physical Anesthesia Plan  ASA: II  Anesthesia Plan: General   Post-op Pain Management:    Induction: Intravenous  PONV Risk Score and Plan: 4 or greater and Ondansetron, Dexamethasone, Midazolam, Propofol infusion and Treatment may vary due to age or medical condition  Airway Management Planned: Oral ETT and Video Laryngoscope Planned  Additional Equipment:   Intra-op Plan:   Post-operative Plan: Extubation in OR  Informed Consent: I have reviewed the patients History and Physical, chart, labs and discussed the procedure including the risks, benefits and alternatives for the proposed anesthesia with the patient or authorized representative who has indicated his/her understanding and acceptance.     Dental advisory given and Dental Advisory Given  Plan  Discussed with: CRNA  Anesthesia Plan Comments: (Reviewed PAT note written 07/17/2019 by Myra Gianotti, PA-C. )     Anesthesia Quick Evaluation

## 2019-07-19 ENCOUNTER — Inpatient Hospital Stay (HOSPITAL_COMMUNITY): Payer: Medicare Other | Admitting: Vascular Surgery

## 2019-07-19 ENCOUNTER — Encounter (HOSPITAL_COMMUNITY)
Admission: RE | Disposition: A | Discharge status: Home / Self Care | Payer: Self-pay | Source: Home / Self Care | Attending: Neurosurgery

## 2019-07-19 ENCOUNTER — Observation Stay (HOSPITAL_COMMUNITY)
Admission: RE | Admit: 2019-07-19 | Discharge: 2019-07-20 | Disposition: A | Discharge status: Home / Self Care | Payer: Medicare Other | Source: Home / Self Care | Attending: Neurosurgery | Admitting: Neurosurgery

## 2019-07-19 ENCOUNTER — Other Ambulatory Visit: Payer: Self-pay

## 2019-07-19 ENCOUNTER — Inpatient Hospital Stay (HOSPITAL_COMMUNITY): Payer: Medicare Other | Admitting: Anesthesiology

## 2019-07-19 ENCOUNTER — Inpatient Hospital Stay (HOSPITAL_COMMUNITY): Payer: Medicare Other

## 2019-07-19 DIAGNOSIS — Z85828 Personal history of other malignant neoplasm of skin: Secondary | ICD-10-CM | POA: Insufficient documentation

## 2019-07-19 DIAGNOSIS — L7632 Postprocedural hematoma of skin and subcutaneous tissue following other procedure: Secondary | ICD-10-CM | POA: Diagnosis not present

## 2019-07-19 DIAGNOSIS — M50021 Cervical disc disorder at C4-C5 level with myelopathy: Secondary | ICD-10-CM | POA: Diagnosis not present

## 2019-07-19 DIAGNOSIS — J45909 Unspecified asthma, uncomplicated: Secondary | ICD-10-CM | POA: Insufficient documentation

## 2019-07-19 DIAGNOSIS — R061 Stridor: Secondary | ICD-10-CM | POA: Diagnosis not present

## 2019-07-19 DIAGNOSIS — Z8249 Family history of ischemic heart disease and other diseases of the circulatory system: Secondary | ICD-10-CM | POA: Diagnosis not present

## 2019-07-19 DIAGNOSIS — J9811 Atelectasis: Secondary | ICD-10-CM | POA: Diagnosis not present

## 2019-07-19 DIAGNOSIS — Z96653 Presence of artificial knee joint, bilateral: Secondary | ICD-10-CM | POA: Insufficient documentation

## 2019-07-19 DIAGNOSIS — I10 Essential (primary) hypertension: Secondary | ICD-10-CM | POA: Insufficient documentation

## 2019-07-19 DIAGNOSIS — Z96651 Presence of right artificial knee joint: Secondary | ICD-10-CM | POA: Diagnosis not present

## 2019-07-19 DIAGNOSIS — Z419 Encounter for procedure for purposes other than remedying health state, unspecified: Secondary | ICD-10-CM

## 2019-07-19 DIAGNOSIS — M4712 Other spondylosis with myelopathy, cervical region: Secondary | ICD-10-CM | POA: Diagnosis not present

## 2019-07-19 DIAGNOSIS — F419 Anxiety disorder, unspecified: Secondary | ICD-10-CM | POA: Insufficient documentation

## 2019-07-19 DIAGNOSIS — Z20822 Contact with and (suspected) exposure to covid-19: Secondary | ICD-10-CM | POA: Diagnosis not present

## 2019-07-19 DIAGNOSIS — M4722 Other spondylosis with radiculopathy, cervical region: Secondary | ICD-10-CM | POA: Diagnosis present

## 2019-07-19 DIAGNOSIS — K219 Gastro-esophageal reflux disease without esophagitis: Secondary | ICD-10-CM | POA: Insufficient documentation

## 2019-07-19 DIAGNOSIS — M5412 Radiculopathy, cervical region: Secondary | ICD-10-CM | POA: Insufficient documentation

## 2019-07-19 DIAGNOSIS — R7303 Prediabetes: Secondary | ICD-10-CM | POA: Insufficient documentation

## 2019-07-19 DIAGNOSIS — M4802 Spinal stenosis, cervical region: Secondary | ICD-10-CM | POA: Insufficient documentation

## 2019-07-19 DIAGNOSIS — M4322 Fusion of spine, cervical region: Secondary | ICD-10-CM | POA: Diagnosis not present

## 2019-07-19 DIAGNOSIS — Z79899 Other long term (current) drug therapy: Secondary | ICD-10-CM | POA: Diagnosis not present

## 2019-07-19 DIAGNOSIS — R0602 Shortness of breath: Secondary | ICD-10-CM | POA: Diagnosis not present

## 2019-07-19 DIAGNOSIS — M50121 Cervical disc disorder at C4-C5 level with radiculopathy: Secondary | ICD-10-CM | POA: Diagnosis not present

## 2019-07-19 DIAGNOSIS — S1093XA Contusion of unspecified part of neck, initial encounter: Secondary | ICD-10-CM | POA: Diagnosis not present

## 2019-07-19 DIAGNOSIS — G992 Myelopathy in diseases classified elsewhere: Secondary | ICD-10-CM | POA: Insufficient documentation

## 2019-07-19 HISTORY — PX: ANTERIOR CERVICAL DECOMP/DISCECTOMY FUSION: SHX1161

## 2019-07-19 LAB — GLUCOSE, CAPILLARY: Glucose-Capillary: 120 mg/dL — ABNORMAL HIGH (ref 70–99)

## 2019-07-19 SURGERY — ANTERIOR CERVICAL DECOMPRESSION/DISCECTOMY FUSION 3 LEVELS
Anesthesia: General

## 2019-07-19 MED ORDER — CEFAZOLIN SODIUM-DEXTROSE 1-4 GM/50ML-% IV SOLN
1.0000 g | Freq: Three times a day (TID) | INTRAVENOUS | Status: AC
  Administered 2019-07-19 – 2019-07-20 (×2): 1 g via INTRAVENOUS
  Filled 2019-07-19 (×2): qty 50

## 2019-07-19 MED ORDER — THROMBIN 5000 UNITS EX SOLR
OROMUCOSAL | Status: DC | PRN
  Administered 2019-07-19: 5 mL via TOPICAL

## 2019-07-19 MED ORDER — THROMBIN 20000 UNITS EX SOLR
CUTANEOUS | Status: DC | PRN
  Administered 2019-07-19: 20 mL via TOPICAL

## 2019-07-19 MED ORDER — LORATADINE 10 MG PO TABS
10.0000 mg | ORAL_TABLET | Freq: Every day | ORAL | Status: DC
  Administered 2019-07-19: 10 mg via ORAL
  Filled 2019-07-19: qty 1

## 2019-07-19 MED ORDER — ONDANSETRON HCL 4 MG/2ML IJ SOLN
INTRAMUSCULAR | Status: AC
  Filled 2019-07-19: qty 6

## 2019-07-19 MED ORDER — METHOCARBAMOL 500 MG PO TABS
500.0000 mg | ORAL_TABLET | Freq: Four times a day (QID) | ORAL | Status: DC | PRN
  Administered 2019-07-19 – 2019-07-20 (×3): 500 mg via ORAL
  Filled 2019-07-19 (×2): qty 1
  Filled 2019-07-19 (×2): qty 2

## 2019-07-19 MED ORDER — BENAZEPRIL HCL 40 MG PO TABS
40.0000 mg | ORAL_TABLET | Freq: Every evening | ORAL | Status: DC
  Administered 2019-07-19: 40 mg via ORAL
  Filled 2019-07-19: qty 1

## 2019-07-19 MED ORDER — LIDOCAINE 2% (20 MG/ML) 5 ML SYRINGE
INTRAMUSCULAR | Status: AC
  Filled 2019-07-19: qty 10

## 2019-07-19 MED ORDER — FENTANYL CITRATE (PF) 250 MCG/5ML IJ SOLN
INTRAMUSCULAR | Status: AC
  Filled 2019-07-19: qty 5

## 2019-07-19 MED ORDER — THROMBIN 20000 UNITS EX SOLR
CUTANEOUS | Status: AC
  Filled 2019-07-19: qty 20000

## 2019-07-19 MED ORDER — ALBUTEROL SULFATE (2.5 MG/3ML) 0.083% IN NEBU: 2.5000 mg | INHALATION_SOLUTION | Freq: Four times a day (QID) | RESPIRATORY_TRACT | Status: DC | PRN

## 2019-07-19 MED ORDER — SODIUM CHLORIDE 0.9 % IV SOLN: 250.0000 mL | INTRAVENOUS | Status: DC

## 2019-07-19 MED ORDER — GABAPENTIN 300 MG PO CAPS
300.0000 mg | ORAL_CAPSULE | Freq: Two times a day (BID) | ORAL | Status: DC
  Administered 2019-07-19: 300 mg via ORAL
  Filled 2019-07-19: qty 1

## 2019-07-19 MED ORDER — HYDROCODONE-ACETAMINOPHEN 10-325 MG PO TABS
2.0000 | ORAL_TABLET | ORAL | Status: DC | PRN
  Administered 2019-07-19 – 2019-07-20 (×3): 2 via ORAL
  Administered 2019-07-20: 1 via ORAL
  Filled 2019-07-19 (×4): qty 2

## 2019-07-19 MED ORDER — PHENOL 1.4 % MT LIQD: 1.0000 | OROMUCOSAL | Status: DC | PRN

## 2019-07-19 MED ORDER — MENTHOL 3 MG MT LOZG
1.0000 | LOZENGE | OROMUCOSAL | Status: DC | PRN
  Filled 2019-07-19: qty 9

## 2019-07-19 MED ORDER — ONDANSETRON HCL 4 MG/2ML IJ SOLN
4.0000 mg | Freq: Four times a day (QID) | INTRAMUSCULAR | Status: DC | PRN
  Administered 2019-07-19: 4 mg via INTRAVENOUS
  Filled 2019-07-19: qty 2

## 2019-07-19 MED ORDER — FLUTICASONE PROPIONATE 50 MCG/ACT NA SUSP
1.0000 | NASAL | Status: DC | PRN
  Filled 2019-07-19: qty 16

## 2019-07-19 MED ORDER — FENTANYL CITRATE (PF) 100 MCG/2ML IJ SOLN
INTRAMUSCULAR | Status: AC
  Filled 2019-07-19: qty 2

## 2019-07-19 MED ORDER — AMLODIPINE BESYLATE 5 MG PO TABS
5.0000 mg | ORAL_TABLET | Freq: Every evening | ORAL | Status: DC
  Administered 2019-07-19: 5 mg via ORAL
  Filled 2019-07-19: qty 1

## 2019-07-19 MED ORDER — DEXAMETHASONE SODIUM PHOSPHATE 10 MG/ML IJ SOLN
INTRAMUSCULAR | Status: AC
  Filled 2019-07-19: qty 1

## 2019-07-19 MED ORDER — LACTATED RINGERS IV SOLN: INTRAVENOUS | Status: DC | PRN

## 2019-07-19 MED ORDER — CHLORHEXIDINE GLUCONATE CLOTH 2 % EX PADS: 6.0000 | MEDICATED_PAD | Freq: Once | CUTANEOUS | Status: DC

## 2019-07-19 MED ORDER — DEXAMETHASONE SODIUM PHOSPHATE 10 MG/ML IJ SOLN
INTRAMUSCULAR | Status: DC | PRN
Start: 2019-07-19 — End: 2019-07-19
  Administered 2019-07-19: 10 mg via INTRAVENOUS

## 2019-07-19 MED ORDER — PRAVASTATIN SODIUM 40 MG PO TABS
40.0000 mg | ORAL_TABLET | Freq: Every day | ORAL | Status: DC
  Administered 2019-07-19: 40 mg via ORAL
  Filled 2019-07-19: qty 1

## 2019-07-19 MED ORDER — METHOCARBAMOL 500 MG PO TABS
ORAL_TABLET | ORAL | Status: AC
  Filled 2019-07-19: qty 1

## 2019-07-19 MED ORDER — DEXAMETHASONE SODIUM PHOSPHATE 10 MG/ML IJ SOLN
10.0000 mg | Freq: Once | INTRAMUSCULAR | Status: DC
  Filled 2019-07-19: qty 1

## 2019-07-19 MED ORDER — PHENYLEPHRINE 40 MCG/ML (10ML) SYRINGE FOR IV PUSH (FOR BLOOD PRESSURE SUPPORT)
PREFILLED_SYRINGE | INTRAVENOUS | Status: AC
  Filled 2019-07-19: qty 20

## 2019-07-19 MED ORDER — FENTANYL CITRATE (PF) 100 MCG/2ML IJ SOLN
25.0000 ug | INTRAMUSCULAR | Status: DC | PRN
  Administered 2019-07-19: 50 ug via INTRAVENOUS

## 2019-07-19 MED ORDER — CEFAZOLIN SODIUM 1 G IJ SOLR
INTRAMUSCULAR | Status: AC
  Filled 2019-07-19: qty 20

## 2019-07-19 MED ORDER — 0.9 % SODIUM CHLORIDE (POUR BTL) OPTIME
TOPICAL | Status: DC | PRN
  Administered 2019-07-19: 1000 mL

## 2019-07-19 MED ORDER — PROPOFOL 10 MG/ML IV BOLUS
INTRAVENOUS | Status: DC | PRN
  Administered 2019-07-19: 150 mg via INTRAVENOUS

## 2019-07-19 MED ORDER — SUCCINYLCHOLINE CHLORIDE 200 MG/10ML IV SOSY
PREFILLED_SYRINGE | INTRAVENOUS | Status: AC
  Filled 2019-07-19: qty 10

## 2019-07-19 MED ORDER — THROMBIN 5000 UNITS EX SOLR
CUTANEOUS | Status: AC
  Filled 2019-07-19: qty 5000

## 2019-07-19 MED ORDER — AMLODIPINE BESY-BENAZEPRIL HCL 5-40 MG PO CAPS: 1.0000 | ORAL_CAPSULE | Freq: Every evening | ORAL | Status: DC

## 2019-07-19 MED ORDER — BUSPIRONE HCL 15 MG PO TABS
30.0000 mg | ORAL_TABLET | Freq: Every day | ORAL | Status: DC | PRN
  Filled 2019-07-19: qty 2

## 2019-07-19 MED ORDER — ROCURONIUM BROMIDE 10 MG/ML (PF) SYRINGE
PREFILLED_SYRINGE | INTRAVENOUS | Status: DC | PRN
  Administered 2019-07-19 (×2): 10 mg via INTRAVENOUS
  Administered 2019-07-19: 60 mg via INTRAVENOUS

## 2019-07-19 MED ORDER — PROPOFOL 10 MG/ML IV BOLUS
INTRAVENOUS | Status: AC
  Filled 2019-07-19: qty 20

## 2019-07-19 MED ORDER — HYDROCODONE-ACETAMINOPHEN 5-325 MG PO TABS: 1.0000 | ORAL_TABLET | ORAL | Status: DC | PRN

## 2019-07-19 MED ORDER — OLANZAPINE 5 MG PO TABS
5.0000 mg | ORAL_TABLET | Freq: Every day | ORAL | Status: DC
  Administered 2019-07-19: 5 mg via ORAL
  Filled 2019-07-19: qty 1

## 2019-07-19 MED ORDER — PROPOFOL 500 MG/50ML IV EMUL
INTRAVENOUS | Status: DC | PRN
  Administered 2019-07-19: 15 ug/kg/min via INTRAVENOUS

## 2019-07-19 MED ORDER — SODIUM CHLORIDE 0.9 % IV SOLN
INTRAVENOUS | Status: DC | PRN
  Administered 2019-07-19: 500 mL

## 2019-07-19 MED ORDER — ACETAMINOPHEN 325 MG PO TABS: 650.0000 mg | ORAL_TABLET | ORAL | Status: DC | PRN

## 2019-07-19 MED ORDER — CEFAZOLIN SODIUM-DEXTROSE 2-4 GM/100ML-% IV SOLN
2.0000 g | INTRAVENOUS | Status: AC
  Administered 2019-07-19: 2 g via INTRAVENOUS
  Filled 2019-07-19: qty 100

## 2019-07-19 MED ORDER — SODIUM CHLORIDE 0.9% FLUSH
3.0000 mL | Freq: Two times a day (BID) | INTRAVENOUS | Status: DC
  Administered 2019-07-19: 3 mL via INTRAVENOUS

## 2019-07-19 MED ORDER — BUPIVACAINE HCL (PF) 0.5 % IJ SOLN
INTRAMUSCULAR | Status: AC
  Filled 2019-07-19: qty 10

## 2019-07-19 MED ORDER — ACETAMINOPHEN 500 MG PO TABS
1000.0000 mg | ORAL_TABLET | Freq: Once | ORAL | Status: DC
  Filled 2019-07-19: qty 2

## 2019-07-19 MED ORDER — ONDANSETRON HCL 4 MG/2ML IJ SOLN: 4.0000 mg | Freq: Once | INTRAMUSCULAR | Status: DC | PRN

## 2019-07-19 MED ORDER — SODIUM CHLORIDE 0.9% FLUSH: 3.0000 mL | INTRAVENOUS | Status: DC | PRN

## 2019-07-19 MED ORDER — FAMOTIDINE 20 MG PO TABS
40.0000 mg | ORAL_TABLET | Freq: Every day | ORAL | Status: DC
  Administered 2019-07-19: 40 mg via ORAL
  Filled 2019-07-19: qty 2

## 2019-07-19 MED ORDER — FENTANYL CITRATE (PF) 100 MCG/2ML IJ SOLN
INTRAMUSCULAR | Status: DC | PRN
  Administered 2019-07-19 (×2): 50 ug via INTRAVENOUS
  Administered 2019-07-19: 100 ug via INTRAVENOUS

## 2019-07-19 MED ORDER — HYDROMORPHONE HCL 1 MG/ML IJ SOLN
1.0000 mg | INTRAMUSCULAR | Status: DC | PRN
  Administered 2019-07-19: 1 mg via INTRAVENOUS
  Filled 2019-07-19: qty 1

## 2019-07-19 MED ORDER — SUGAMMADEX SODIUM 200 MG/2ML IV SOLN
INTRAVENOUS | Status: DC | PRN
  Administered 2019-07-19: 200 mg via INTRAVENOUS

## 2019-07-19 MED ORDER — ONDANSETRON HCL 4 MG/2ML IJ SOLN
INTRAMUSCULAR | Status: DC | PRN
  Administered 2019-07-19: 4 mg via INTRAVENOUS

## 2019-07-19 MED ORDER — ALBUTEROL SULFATE HFA 108 (90 BASE) MCG/ACT IN AERS: 2.0000 | INHALATION_SPRAY | Freq: Four times a day (QID) | RESPIRATORY_TRACT | Status: DC | PRN

## 2019-07-19 MED ORDER — ROCURONIUM BROMIDE 10 MG/ML (PF) SYRINGE
PREFILLED_SYRINGE | INTRAVENOUS | Status: AC
  Filled 2019-07-19: qty 10

## 2019-07-19 MED ORDER — MONTELUKAST SODIUM 10 MG PO TABS
10.0000 mg | ORAL_TABLET | Freq: Every day | ORAL | Status: DC
  Administered 2019-07-19: 10 mg via ORAL
  Filled 2019-07-19: qty 1

## 2019-07-19 MED ORDER — ACETAMINOPHEN 650 MG RE SUPP: 650.0000 mg | RECTAL | Status: DC | PRN

## 2019-07-19 MED ORDER — PHENYLEPHRINE HCL-NACL 10-0.9 MG/250ML-% IV SOLN
INTRAVENOUS | Status: DC | PRN
  Administered 2019-07-19: 50 ug/min via INTRAVENOUS

## 2019-07-19 MED ORDER — LIDOCAINE 2% (20 MG/ML) 5 ML SYRINGE
INTRAMUSCULAR | Status: DC | PRN
  Administered 2019-07-19: 60 mg via INTRAVENOUS

## 2019-07-19 MED ORDER — ONDANSETRON HCL 4 MG PO TABS: 4.0000 mg | ORAL_TABLET | Freq: Four times a day (QID) | ORAL | Status: DC | PRN

## 2019-07-19 SURGICAL SUPPLY — 65 items
APL SKNCLS STERI-STRIP NONHPOA (GAUZE/BANDAGES/DRESSINGS) ×1
BAG DECANTER FOR FLEXI CONT (MISCELLANEOUS) ×2 IMPLANT
BAND INSRT 18 STRL LF DISP RB (MISCELLANEOUS) ×2
BAND RUBBER #18 3X1/16 STRL (MISCELLANEOUS) ×4 IMPLANT
BENZOIN TINCTURE PRP APPL 2/3 (GAUZE/BANDAGES/DRESSINGS) ×2 IMPLANT
BIT DRILL 13 (BIT) ×1 IMPLANT
BUR MATCHSTICK NEURO 3.0 LAGG (BURR) ×2 IMPLANT
CAGE PEEK 6X14X11 (Cage) ×4 IMPLANT
CAGE PEEK 7X14X11 (Cage) ×2 IMPLANT
CANISTER SUCT 3000ML PPV (MISCELLANEOUS) ×2 IMPLANT
CARTRIDGE OIL MAESTRO DRILL (MISCELLANEOUS) ×1 IMPLANT
COVER WAND RF STERILE (DRAPES) ×1 IMPLANT
DIFFUSER DRILL AIR PNEUMATIC (MISCELLANEOUS) ×2 IMPLANT
DRAPE C-ARM 42X72 X-RAY (DRAPES) ×4 IMPLANT
DRAPE LAPAROTOMY 100X72 PEDS (DRAPES) ×2 IMPLANT
DRAPE MICROSCOPE LEICA (MISCELLANEOUS) ×2 IMPLANT
DURAPREP 6ML APPLICATOR 50/CS (WOUND CARE) ×2 IMPLANT
ELECT COATED BLADE 2.86 ST (ELECTRODE) ×2 IMPLANT
ELECT REM PT RETURN 9FT ADLT (ELECTROSURGICAL) ×2
ELECTRODE REM PT RTRN 9FT ADLT (ELECTROSURGICAL) ×1 IMPLANT
GAUZE 4X4 16PLY RFD (DISPOSABLE) IMPLANT
GAUZE SPONGE 4X4 12PLY STRL (GAUZE/BANDAGES/DRESSINGS) ×2 IMPLANT
GAUZE SPONGE 4X4 12PLY STRL LF (GAUZE/BANDAGES/DRESSINGS) ×1 IMPLANT
GLOVE BIO SURGEON STRL SZ 6.5 (GLOVE) ×3 IMPLANT
GLOVE BIOGEL PI IND STRL 6.5 (GLOVE) IMPLANT
GLOVE BIOGEL PI INDICATOR 6.5 (GLOVE) ×3
GLOVE ECLIPSE 9.0 STRL (GLOVE) ×2 IMPLANT
GLOVE EXAM NITRILE XL STR (GLOVE) IMPLANT
GLOVE INDICATOR 7.5 STRL GRN (GLOVE) ×1 IMPLANT
GLOVE SURG SS PI 7.0 STRL IVOR (GLOVE) ×2 IMPLANT
GOWN STRL REUS W/ TWL LRG LVL3 (GOWN DISPOSABLE) IMPLANT
GOWN STRL REUS W/ TWL XL LVL3 (GOWN DISPOSABLE) IMPLANT
GOWN STRL REUS W/TWL 2XL LVL3 (GOWN DISPOSABLE) IMPLANT
GOWN STRL REUS W/TWL LRG LVL3 (GOWN DISPOSABLE)
GOWN STRL REUS W/TWL XL LVL3 (GOWN DISPOSABLE)
HALTER HD/CHIN CERV TRACTION D (MISCELLANEOUS) ×2 IMPLANT
HEMOSTAT POWDER KIT SURGIFOAM (HEMOSTASIS) ×2 IMPLANT
KIT BASIN OR (CUSTOM PROCEDURE TRAY) ×2 IMPLANT
KIT TURNOVER KIT B (KITS) ×2 IMPLANT
NDL SPNL 20GX3.5 QUINCKE YW (NEEDLE) ×1 IMPLANT
NEEDLE SPNL 20GX3.5 QUINCKE YW (NEEDLE) ×2 IMPLANT
NS IRRIG 1000ML POUR BTL (IV SOLUTION) ×2 IMPLANT
OIL CARTRIDGE MAESTRO DRILL (MISCELLANEOUS) ×2
PACK LAMINECTOMY NEURO (CUSTOM PROCEDURE TRAY) ×2 IMPLANT
PAD ARMBOARD 7.5X6 YLW CONV (MISCELLANEOUS) ×6 IMPLANT
PLATE 3 57.5XLCK NS SPNE CVD (Plate) IMPLANT
PLATE 3 ATLANTIS TRANS (Plate) ×2 IMPLANT
SCREW ST 15X4XST FXANG NS (Screw) IMPLANT
SCREW ST FIX 4 ATL (Screw) ×2 IMPLANT
SCREW ST FIX 4 ATL 3120213 (Screw) ×7 IMPLANT
SPACER SPNL 11X14X6XPEEK CVD (Cage) IMPLANT
SPACER SPNL 11X14X7XPEEK CVD (Cage) IMPLANT
SPCR SPNL 11X14X6XPEEK CVD (Cage) ×2 IMPLANT
SPCR SPNL 11X14X7XPEEK CVD (Cage) ×1 IMPLANT
SPONGE INTESTINAL PEANUT (DISPOSABLE) ×2 IMPLANT
SPONGE SURGIFOAM ABS GEL 100 (HEMOSTASIS) ×2 IMPLANT
STRIP CLOSURE SKIN 1/2X4 (GAUZE/BANDAGES/DRESSINGS) ×2 IMPLANT
SUT VIC AB 3-0 SH 8-18 (SUTURE) ×2 IMPLANT
SUT VIC AB 4-0 RB1 18 (SUTURE) ×2 IMPLANT
TAPE CLOTH 4X10 WHT NS (GAUZE/BANDAGES/DRESSINGS) ×2 IMPLANT
TAPE PAPER 3X10 WHT MICROPORE (GAUZE/BANDAGES/DRESSINGS) ×1 IMPLANT
TOWEL GREEN STERILE (TOWEL DISPOSABLE) ×2 IMPLANT
TOWEL GREEN STERILE FF (TOWEL DISPOSABLE) ×2 IMPLANT
TRAP SPECIMEN MUCUS 40CC (MISCELLANEOUS) ×3 IMPLANT
WATER STERILE IRR 1000ML POUR (IV SOLUTION) ×2 IMPLANT

## 2019-07-19 NOTE — H&P (Signed)
[name] is an 69 y.o. female.   Chief Complaint: Neck pain HPI: 70 year old female with neck pain and progressive bilateral upper extremity pain with some numbness and loss of dexterity into both upper extremities.  Work-up demonstrates evidence of severe spinal stenosis with spinal cord compression at C4-5, C5-6 and C6-7.  Patient presents now for 3 level anterior cervical decompression and fusion in hopes of improving her symptoms.  Past Medical History:  Diagnosis Date  . Anemia   . Anxiety   . Arthritis   . Asthma   . Cancer ([place]) 2002   Squamous Carcinoma  . Depression   . GERD (gastroesophageal reflux disease)   . Heart murmur    Mitral Valve prolapse  . History of kidney stones   . Hypertension   . Pneumonia   . PONV (postoperative nausea and vomiting) 2003   Too much anesthesia; hard to wake up/coma; N/V  . Pre-diabetes     Past Surgical History:  Procedure Laterality Date  . ABDOMINAL HYSTERECTOMY    . EYE SURGERY  2020   Cataract lens replacement  . FRACTURE SURGERY Left 2003   Humerous fracture  . JOINT REPLACEMENT     right and left knee replacement  . kidney stone removed    . TONSILLECTOMY    . TOTAL KNEE REVISION Right 05/30/2016   Procedure: TOTAL KNEE REVISION;  Surgeon: Vickey Huger, MD;  Location: [facility];  Service: Orthopedics;  Laterality: Right;  . WRIST ARTHROPLASTY Left     Family History  Problem Relation Age of Onset  . Heart attack Brother    Social History:  reports that she has never smoked. She has never used smokeless tobacco. She reports that she does not drink alcohol or use drugs.  Allergies: No Known Allergies  Medications Prior to Admission  Medication Sig Dispense Refill  . albuterol (PROVENTIL HFA;VENTOLIN HFA) 108 (90 BASE) MCG/ACT inhaler Inhale 2 puffs into the lungs every 6 (six) hours as needed for wheezing or shortness of breath.    Marland Kitchen albuterol (PROVENTIL) (2.5 MG/3ML) 0.083% nebulizer solution Take 2.5 mg by  nebulization every 6 (six) hours as needed (for wheezing/shortness of breath).     Marland Kitchen amLODipine-benazepril (LOTREL) 5-40 MG capsule Take 1 capsule by mouth every evening.     . busPIRone (BUSPAR) 30 MG tablet Take 30 mg by mouth daily as needed (for anxiety.).    Marland Kitchen Calcium-Magnesium-Vitamin D (CALCIUM 500 PO) Take 500 mg by mouth 2 (two) times daily.     . famotidine (PEPCID) 40 MG tablet Take 40 mg by mouth daily.    . fexofenadine (ALLEGRA) 180 MG tablet TAKE 1 TABLET DAILY 30 tablet 0  . fluticasone (FLONASE) 50 MCG/ACT nasal spray Place 1 spray into both nostrils as needed for allergies or rhinitis. 16 g 4  . gabapentin (NEURONTIN) 300 MG capsule Take 300 mg by mouth in the morning and at bedtime.    . methocarbamol (ROBAXIN) 500 MG tablet Take 1-2 tablets (500-1,000 mg total) by mouth every 6 (six) hours as needed for muscle spasms. 60 tablet 0  . montelukast (SINGULAIR) 10 MG tablet Take 10 mg by mouth daily at 6 PM.     . OLANZapine (ZYPREXA) 5 MG tablet Take 5 mg by mouth at bedtime.    . pravastatin (PRAVACHOL) 40 MG tablet Take 40 mg by mouth at bedtime.    Marland Kitchen denosumab (PROLIA) 60 MG/ML SOLN injection Inject 60 mg into the skin every 6 (six) months.    Marland Kitchen  EPINEPHrine 0.3 mg/0.3 mL IJ SOAJ injection Inject 0.3 mg into the muscle daily. For anaphylactic allergic reaction.      Results for orders placed or performed during the hospital encounter of 07/19/19 (from the past 48 hour(s))  Glucose, capillary     Status: Abnormal   Collection Time: 07/19/19  8:32 AM  Result Value Ref Range   Glucose-Capillary 120 (H) 70 - 99 mg/dL    Comment: Glucose reference range applies only to samples taken after fasting for at least 8 hours.   No results found.  Pertinent items noted in HPI and remainder of comprehensive ROS otherwise negative.  Blood pressure 120/79, pulse 79, temperature 98.3 F (36.8 C), temperature source Oral, resp. rate 18, height 5\' 1"  (1.549 m), weight 77.6 kg, SpO2 98  %.  Patient is awake and alert.  She is oriented and appropriate.  Speech is fluent.  Judgment insight are intact.  Cranial nerve function normal bilateral.  Motor examination reveals mild weakness of grip and intrinsic strength in both hands.  Lower extremity strength normal.  Sensor examination with patchy distal sensory loss in both upper extremities.  Reflexes are brisk.  Hoffmann's responses present in both hands.  Gait and posture normal peer examination head ears eyes nose throat is unremarkable her chest and abdomen are benign.  Extremities are free of major deformity. Assessment/Plan Cervical stenosis with myelopathy.  Plan C4-5, C5-6, C6-7 anterior cervical discectomy with interbody fusion utilizing interbody cage, locally harvested autograft and anterior plate instrumentation.  Risks and benefits of been explained.  Patient wishes to proceed.  Mallie Mussel A Tyden Kann 07/19/2019, 10:31 AM

## 2019-07-19 NOTE — Anesthesia Procedure Notes (Signed)
Procedure Name: Intubation Date/Time: 07/19/2019 11:42 AM Performed by: Neldon Newport, CRNA Pre-anesthesia Checklist: Timeout performed, Patient being monitored, Suction available, Emergency Drugs available and Patient identified Patient Re-evaluated:Patient Re-evaluated prior to induction Oxygen Delivery Method: Circle system utilized Preoxygenation: Pre-oxygenation with 100% oxygen Induction Type: IV induction Ventilation: Mask ventilation without difficulty Laryngoscope Size: Glidescope and 3 Grade View: Grade II Tube type: Oral Tube size: 7.0 mm Number of attempts: 1 Placement Confirmation: ETT inserted through vocal cords under direct vision,  positive ETCO2 and breath sounds checked- equal and bilateral Secured at: 21 cm Tube secured with: Tape Dental Injury: Teeth and Oropharynx as per pre-operative assessment

## 2019-07-19 NOTE — Brief Op Note (Signed)
07/19/2019  1:55 PM  PATIENT:  [name]  69 y.o. female  PRE-OPERATIVE DIAGNOSIS:  Stenosis  POST-OPERATIVE DIAGNOSIS:  Stenosis  PROCEDURE:  Procedure(s) with comments: Anterior Cerivcal Discectomy Fusion - Cervical Four-Cervical Five - Cervical Five- Cervical Six - Cervical Six- Cervical Seven (N/A) - Anterior Cerivcal Discectomy Fusion - Cervical Four-Cervical Five - Cervical Five- Cervical Six - Cervical Six- Cervical Seven  SURGEON:  Surgeon(s) and Role:    Earnie Larsson, MD - Primary  PHYSICIAN ASSISTANT:   ASSISTANTSMearl Latin   ANESTHESIA:   general  EBL:  200 mL   BLOOD ADMINISTERED:none  DRAINS: none   LOCAL MEDICATIONS USED:  NONE  SPECIMEN:  No Specimen  DISPOSITION OF SPECIMEN:  N/A  COUNTS:  YES  TOURNIQUET:  * No tourniquets in log *  DICTATION: .Dragon Dictation  PLAN OF CARE: Admit to inpatient   PATIENT DISPOSITION:  PACU - hemodynamically stable.   Delay start of Pharmacological VTE agent (>24hrs) due to surgical blood loss or risk of bleeding: yes

## 2019-07-19 NOTE — Transfer of Care (Signed)
Immediate Anesthesia Transfer of Care Note  Patient: [name]  Procedure(s) Performed: Anterior Cerivcal Discectomy Fusion - Cervical Four-Cervical Five - Cervical Five- Cervical Six - Cervical Six- Cervical Seven (N/A )  Patient Location: PACU  Anesthesia Type:General  Level of Consciousness: awake, alert  and oriented  Airway & Oxygen Therapy: Patient Spontanous Breathing and Patient connected to nasal cannula oxygen  Post-op Assessment: Report given to RN, Post -op Vital signs reviewed and stable and Patient moving all extremities X 4  Post vital signs: Reviewed and stable  Last Vitals:  Vitals Value Taken Time  BP 114/63 07/19/19 1410  Temp    Pulse 74 07/19/19 1411  Resp 20 07/19/19 1411  SpO2 99 % 07/19/19 1411  Vitals shown include unvalidated device data.  Last Pain:  Vitals:   07/19/19 0909  TempSrc:   PainSc: 0-No pain      Patients Stated Pain Goal: 2 ([id number])  Complications: No apparent anesthesia complications

## 2019-07-19 NOTE — Op Note (Signed)
Date of procedure: 07/19/2019  Date of dictation: Same  Service: Neurosurgery  Preoperative diagnosis: C4-5, C5-6, C6-7 stenosis with myelopathy and radiculopathy  Postoperative diagnosis: Same  Procedure Name: C4-5, C5-6, C6-7 anterior cervical discectomy with interbody fusion utilizing interbody peek cage, morselized local autograft, and anterior plate instrumentation.  Surgeon:Ezekeil Bethel A.Lauris Serviss, M.D.  Asst. Surgeon: Reinaldo Meeker, NP  Anesthesia: General  Indication: 69 year old female with progressive neck and bilateral upper extremity symptoms failing conservative management her work-up demonstrates evidence of severe cervical stenosis with cord and exiting nerve root compression at C4-5, C5-6 and C6-7.  Patient presents now for three level anterior cervical decompression and fusion in hopes of improving her symptoms.  Operative note: After induction anesthesia, patient positioned supine with neck slightly extended held placed halter traction.  Patient's anterior cervical region prepped and draped sterilely.  Incision made overlying C5-6.  Dissection performed on the right.  Retractor placed.  Fluoroscopy used.  Levels confirmed.  Displaces at C4-5, C5-6 and C6-7 were incised.  Discectomies then performed using various instruments down to level the posterior annulus.  Microscope was then brought to the field used throughout the remainder of the discectomies.  Remaining aspects of annulus and osteophytes removed using the high-speed drill down to level the posterior logical ligament.  Posterior logical is not elevated and resected in piecemeal fashion.  Underlying thecal sac was then identified.  A wide central decompression then performed undercutting the bodies of C4 and C5.  Decompression then proceeded into each neural foramina.  Wide anterior foraminotomies were performed along course exiting C5 nerve roots bilaterally.  At this point a very thorough decompression been achieved.  There was no  evidence of injury to thecal sac and nerve roots.  Procedure was then repeated at C5-6 and C6-7 again without complication.  At this point a very thorough decompression been achieved at all levels.  Wounds were then irrigated.  Morselized autograft was then packed into [employer] anatomic peek cages.  Each cage was then impacted in the place at all three levels and recessed slightly from the anterior cortical margins.  Medtronic anterior cervical plate, Atlantis translational was placed over the C4, C5, C6 and C7 levels.  This then attached under fluoroscopic guidance using 13 mm fixed angle screws to each at all four levels.  All screws given final tightening found to be solidly within the bone.  Locking screws engaged in all four levels.  Final images reveal good to the cages and the hardware at the proper upper level with normal alignment of the spine.  Wound is then irrigated one final time.  Hemostasis was assured.  Wounds and closed in typical fashion.  Steri-Strips sterile dressing were applied.  No apparent complications.  Patient tolerated the procedure well and she returns to recovery room postop.

## 2019-07-19 NOTE — Anesthesia Postprocedure Evaluation (Signed)
Anesthesia Post Note  Patient: [name]  Procedure(s) Performed: Anterior Cerivcal Discectomy Fusion - Cervical Four-Cervical Five - Cervical Five- Cervical Six - Cervical Six- Cervical Seven (N/A )     Patient location during evaluation: PACU Anesthesia Type: General Level of consciousness: awake Pain management: pain level controlled Vital Signs Assessment: post-procedure vital signs reviewed and stable Respiratory status: spontaneous breathing, nonlabored ventilation, respiratory function stable and patient connected to nasal cannula oxygen Cardiovascular status: blood pressure returned to baseline and stable Postop Assessment: no apparent nausea or vomiting Anesthetic complications: no    Last Vitals:  Vitals:   07/19/19 1517 07/19/19 1608  BP: 117/70 127/69  Pulse: 77 72  Resp: 20 18  Temp: 37.1 C (!) 36.4 C  SpO2: 93% 96%    Last Pain:  Vitals:   07/19/19 1608  TempSrc: Oral  PainSc:                  [name] P [name]

## 2019-07-20 MED ORDER — OXYCODONE-ACETAMINOPHEN 5-325 MG PO TABS: 1.0000 | ORAL_TABLET | ORAL | 0 refills | Status: AC | PRN

## 2019-07-20 MED ORDER — METHOCARBAMOL 500 MG PO TABS: 500.0000 mg | ORAL_TABLET | Freq: Four times a day (QID) | ORAL | 0 refills | Status: DC

## 2019-07-20 NOTE — Progress Notes (Signed)
Patient is discharged from room 3C10 at this time. Alert and in stable condition. IV site d/c'd and instructions read to patient and spouse with understanding verbalized and all questions answered. Left unit via wheelchair with all belongings at side. 

## 2019-07-20 NOTE — Discharge Summary (Signed)
Physician Discharge Summary  Patient ID: [name] MRN: [id number] DOB/AGE: Nov 08, 1950 69 y.o.  Admit date: 07/19/2019 Discharge date: 07/20/2019  Admission Diagnoses: C4-5, C5-6, C6-7 stenosis with myelopathy and radiculopathy   Discharge Diagnoses: same   Discharged Condition: good  Hospital Course: The patient was admitted on 07/19/2019 and taken to the operating room where the patient underwent acdf C4-7. The patient tolerated the procedure well and was taken to the recovery room and then to the floor in stable condition. The hospital course was routine. There were no complications. The wound remained clean dry and intact. Pt had appropriate neck soreness. No complaints of arm pain or new N/T/W. The patient remained afebrile with stable vital signs, and tolerated a regular diet. The patient continued to increase activities, and pain was well controlled with oral pain medications.   Consults: None  Significant Diagnostic Studies:  Results for orders placed or performed during the hospital encounter of 07/19/19  Glucose, capillary  Result Value Ref Range   Glucose-Capillary 120 (H) 70 - 99 mg/dL    DG Cervical Spine 2-3 Views  Result Date: 07/19/2019 CLINICAL DATA:  Cervical fusion. EXAM: CERVICAL SPINE - 2-3 VIEW; DG C-ARM 1-60 MIN COMPARISON:  03/05/2019 cervical spine radiographs FLUOROSCOPY TIME:  Fluoroscopy Time:  9 seconds FINDINGS: Two lateral intraoperative fluoroscopic images of the cervical spine are provided. ACDF has been performed from C4-C7 with placement of interbody implants, an anterior fusion plate, and screws. An endotracheal tube is partially visualized. IMPRESSION: Intraoperative images during C4-C7 ACDF. Electronically Signed   By: Logan Bores M.D.   On: 07/19/2019 15:55   DG C-Arm 1-60 Min  Result Date: 07/19/2019 CLINICAL DATA:  Cervical fusion. EXAM: CERVICAL SPINE - 2-3 VIEW; DG C-ARM 1-60 MIN COMPARISON:  03/05/2019 cervical spine radiographs  FLUOROSCOPY TIME:  Fluoroscopy Time:  9 seconds FINDINGS: Two lateral intraoperative fluoroscopic images of the cervical spine are provided. ACDF has been performed from C4-C7 with placement of interbody implants, an anterior fusion plate, and screws. An endotracheal tube is partially visualized. IMPRESSION: Intraoperative images during C4-C7 ACDF. Electronically Signed   By: Logan Bores M.D.   On: 07/19/2019 15:55    Antibiotics:  Anti-infectives (From admission, onward)   Start     Dose/Rate Route Frequency Ordered Stop   07/19/19 2000  ceFAZolin (ANCEF) IVPB 1 g/50 mL premix     1 g 100 mL/hr over 30 Minutes Intravenous Every 8 hours 07/19/19 1616 07/20/19 0418   07/19/19 1213  bacitracin 50,000 Units in sodium chloride 0.9 % 500 mL irrigation  Status:  Discontinued       As needed 07/19/19 1213 07/19/19 1405   07/19/19 0900  ceFAZolin (ANCEF) IVPB 2g/100 mL premix     2 g 200 mL/hr over 30 Minutes Intravenous On call to O.R. 07/19/19 0845 07/19/19 1218      Discharge Exam: Blood pressure (!) 106/93, pulse 86, temperature 98.2 F (36.8 C), temperature source Oral, resp. rate 18, height 5\' 1"  (1.549 m), weight 77.6 kg, SpO2 100 %. Neurologic: Grossly normal Ambulating and voiding well, incision cdi  Discharge Medications:   Allergies as of 07/20/2019   No Known Allergies     Medication List    TAKE these medications   albuterol 108 (90 Base) MCG/ACT inhaler Commonly known as: VENTOLIN HFA Inhale 2 puffs into the lungs every 6 (six) hours as needed for wheezing or shortness of breath.   albuterol (2.5 MG/3ML) 0.083% nebulizer solution Commonly known as: PROVENTIL Take 2.5  mg by nebulization every 6 (six) hours as needed (for wheezing/shortness of breath).   amLODipine-benazepril 5-40 MG capsule Commonly known as: LOTREL Take 1 capsule by mouth every evening.   busPIRone 30 MG tablet Commonly known as: BUSPAR Take 30 mg by mouth daily as needed (for anxiety.).   CALCIUM  500 PO Take 500 mg by mouth 2 (two) times daily.   denosumab 60 MG/ML Soln injection Commonly known as: PROLIA Inject 60 mg into the skin every 6 (six) months.   EPINEPHrine 0.3 mg/0.3 mL Soaj injection Commonly known as: EPI-PEN Inject 0.3 mg into the muscle daily. For anaphylactic allergic reaction.   famotidine 40 MG tablet Commonly known as: PEPCID Take 40 mg by mouth daily.   fexofenadine 180 MG tablet Commonly known as: ALLEGRA TAKE 1 TABLET DAILY   fluticasone 50 MCG/ACT nasal spray Commonly known as: FLONASE Place 1 spray into both nostrils as needed for allergies or rhinitis.   gabapentin 300 MG capsule Commonly known as: NEURONTIN Take 300 mg by mouth in the morning and at bedtime.   methocarbamol 500 MG tablet Commonly known as: ROBAXIN Take 1-2 tablets (500-1,000 mg total) by mouth every 6 (six) hours as needed for muscle spasms. What changed: Another medication with the same name was added. Make sure you understand how and when to take each.   methocarbamol 500 MG tablet Commonly known as: Robaxin Take 1 tablet (500 mg total) by mouth 4 (four) times daily. What changed: You were already taking a medication with the same name, and this prescription was added. Make sure you understand how and when to take each.   montelukast 10 MG tablet Commonly known as: SINGULAIR Take 10 mg by mouth daily at 6 PM.   OLANZapine 5 MG tablet Commonly known as: ZYPREXA Take 5 mg by mouth at bedtime.   oxyCODONE-acetaminophen 5-325 MG tablet Commonly known as: Percocet Take 1 tablet by mouth every 4 (four) hours as needed for severe pain.   pravastatin 40 MG tablet Commonly known as: PRAVACHOL Take 40 mg by mouth at bedtime.       Disposition: home   Final Dx: acdf C4-7  Discharge Instructions     Remove dressing in 72 hours   Complete by: As directed    Call MD for:  difficulty breathing, headache or visual disturbances   Complete by: As directed    Call MD  for:  hives   Complete by: As directed    Call MD for:  persistant dizziness or light-headedness   Complete by: As directed    Call MD for:  persistant nausea and vomiting   Complete by: As directed    Call MD for:  redness, tenderness, or signs of infection (pain, swelling, redness, odor or green/yellow discharge around incision site)   Complete by: As directed    Call MD for:  severe uncontrolled pain   Complete by: As directed    Call MD for:  temperature >100.4   Complete by: As directed    Diet - low sodium heart healthy   Complete by: As directed    Driving Restrictions   Complete by: As directed    No driving for 2 weeks, no riding in the car for 1 week   Increase activity slowly   Complete by: As directed    Lifting restrictions   Complete by: As directed    No lifting more than 8 lbs         Signed: Ocie Cornfield  Tambria Pfannenstiel 07/20/2019, 8:05 AM

## 2019-07-20 NOTE — Evaluation (Signed)
Occupational Therapy Evaluation Patient Details Name: [name] MRN: [id number] DOB: 1951/02/20 Today's Date: 07/20/2019    History of Present Illness 69 y.o s/p C4-7 ACDF. PMH includes anemia, anxiety, astha, cancer, GERD, PNA, R knee replacement and revision.   Clinical Impression   PTA pt living with spouse and independent for BADL/IADL. At time of eval, pt demonstrating ability to complete transfers independently and BADLs with mod I level of independence. Pt was dressed on arrival, able to manage brace without assist and fasten buttons on button down shirt. No bil UE strength deficits noted, pt reports paraesthesia and pain as also resolved. Gave pt ACDF hand out and discussed precautions applicable to her BADL/IADL routines- pt in understanding. No further OT needs identified, OT will sign off. Thank you for this consult.     Follow Up Recommendations  No OT follow up    Equipment Recommendations  None recommended by OT    Recommendations for Other Services       Precautions / Restrictions Precautions Precautions: None;Cervical Precaution Booklet Issued: Yes (comment) Precaution Comments: reviewed in context of BADL Required Braces or Orthoses: Cervical Brace Cervical Brace: Soft collar Restrictions Weight Bearing Restrictions: No      Mobility Bed Mobility Overal bed mobility: Independent                Transfers Overall transfer level: Independent                    Balance Overall balance assessment: Modified Independent                                         ADL either performed or assessed with clinical judgement   ADL Overall ADL's : Modified independent                                       General ADL Comments: Pt demonstrates with ability to complete BADL at mod I level. Pt was dressed on arrival, able to manage buttons and don/doff brace without assist. Reviewed cervical precautions with pt common  BADL routine. Also discussed IADL routines of pet care and appropriately cleaning cat litter boxes while maintaining precautions.     Vision Patient Visual Report: No change from baseline       Perception     Praxis      Pertinent Vitals/Pain Pain Assessment: Faces Faces Pain Scale: Hurts a little bit Pain Location: incisional site Pain Descriptors / Indicators: Aching Pain Intervention(s): Monitored during session;Repositioned     Hand Dominance Right   Extremity/Trunk Assessment Upper Extremity Assessment Upper Extremity Assessment: Overall WFL for tasks assessed(no bil UE weakness noted. Is able to manipualte and grasp functional objects. Reports pain and parasthesia resolved)   Lower Extremity Assessment Lower Extremity Assessment: Overall WFL for tasks assessed       Communication Communication Communication: No difficulties   Cognition Arousal/Alertness: Awake/alert Behavior During Therapy: WFL for tasks assessed/performed Overall Cognitive Status: Within Functional Limits for tasks assessed                                     General Comments       Exercises     Shoulder Instructions  Home Living Family/patient expects to be discharged to:: Private residence Living Arrangements: Spouse/significant other Available Help at Discharge: Family Type of Home: House Home Access: Stairs to enter Technical brewer of Steps: [place]: One level     Bathroom Shower/Tub: Occupational psychologist: [place]: Environmental consultant - 2 wheels;Shower seat          Prior Functioning/Environment Level of Independence: Independent                 OT Problem List: Decreased knowledge of use of DME or AE;Decreased knowledge of precautions      OT Treatment/Interventions:      OT Goals(Current goals can be found in the care plan section) Acute Rehab OT Goals Patient Stated Goal: return to independence OT  Goal Formulation: All assessment and education complete, DC therapy  OT Frequency:     Barriers to D/C:            Co-evaluation              AM-PAC OT "6 Clicks" Daily Activity     Outcome Measure Help from another person eating meals?: None Help from another person taking care of personal grooming?: None Help from another person toileting, which includes using toliet, bedpan, or urinal?: None Help from another person bathing (including washing, rinsing, drying)?: None Help from another person to put on and taking off regular upper body clothing?: None Help from another person to put on and taking off regular lower body clothing?: None 6 Click Score: 24   End of Session Equipment Utilized During Treatment: Cervical collar Nurse Communication: Mobility status;Precautions  Activity Tolerance: Patient tolerated treatment well Patient left: in bed;with call bell/phone within reach  OT Visit Diagnosis: Other abnormalities of gait and mobility (R26.89)                [id number] OT Time Calculation (min): 20 min Charges:  OT General Charges $OT Visit: 1 Visit OT Evaluation $OT Eval Low Complexity: 1 Low  Zenovia Jarred, MSOT, OTR/L Acute Rehabilitation Services [facility] Office Number: [phone] Pager: [phone]  Zenovia Jarred 07/20/2019, 9:11 AM

## 2019-07-20 NOTE — Care Management Obs Status (Signed)
[place] NOTIFICATION   Patient Details  Name: [name] MRN: [id number] Date of Birth: [birth year]   Medicare Observation Status Notification Given:  Yes    Claudie Leach, RN 07/20/2019, 9:07 AM

## 2019-07-20 NOTE — Discharge Instructions (Signed)
Wound Care Keep incision area dry.  You may remove outer bandage after 2 days and shower.   If you shower prior cover incision with plastic wrap.  Do not put any creams, lotions, or ointments on incision. Leave steri-strips on neck.  They will fall off by themselves. Activity Walk each and every day, increasing distance each day. No lifting greater than 5 lbs.  Avoid excessive neck motion. No driving for 2 weeks; may ride as a passenger locally. Wear neck brace at all times except when showering. Diet Resume your normal diet.  Return to Work Will be discussed at you follow up appointment. Call Your Doctor If Any of These Occur Redness, drainage, or swelling at the wound.  Temperature greater than 101 degrees. Severe pain not relieved by pain medication. Increased difficulty swallowing.  Incision starts to come apart. Follow Up Appt Call today and ask for Wells Guiles for appointment in 1-2 weeks [phone]) or for problems.

## 2019-07-20 NOTE — Care Management CC44 (Signed)
Condition Code 44 Documentation Completed  Patient Details  Name: Taiya Bogus MRN: [id number] Date of Birth: 1950/08/08   Condition Code 44 given:  Yes Patient signature on Condition Code 44 notice:  Yes Documentation of 2 MD's agreement:  Yes Code 44 added to claim:  Yes    Claudie Leach, RN 07/20/2019, 9:07 AM

## 2019-07-21 ENCOUNTER — Emergency Department (HOSPITAL_COMMUNITY): Payer: Medicare Other

## 2019-07-21 ENCOUNTER — Other Ambulatory Visit: Payer: Self-pay

## 2019-07-21 ENCOUNTER — Inpatient Hospital Stay (HOSPITAL_COMMUNITY)
Admission: EM | Admit: 2019-07-21 | Discharge: 2019-07-23 | DRG: 029 | Disposition: A | Discharge status: Home / Self Care | Payer: Medicare Other | Attending: Neurosurgery | Admitting: Neurosurgery

## 2019-07-21 ENCOUNTER — Encounter (HOSPITAL_COMMUNITY): Payer: Self-pay | Admitting: Emergency Medicine

## 2019-07-21 DIAGNOSIS — I10 Essential (primary) hypertension: Secondary | ICD-10-CM | POA: Diagnosis present

## 2019-07-21 DIAGNOSIS — T148XXA Other injury of unspecified body region, initial encounter: Secondary | ICD-10-CM

## 2019-07-21 DIAGNOSIS — Z79899 Other long term (current) drug therapy: Secondary | ICD-10-CM | POA: Diagnosis not present

## 2019-07-21 DIAGNOSIS — Z96651 Presence of right artificial knee joint: Secondary | ICD-10-CM | POA: Diagnosis not present

## 2019-07-21 DIAGNOSIS — R061 Stridor: Secondary | ICD-10-CM | POA: Diagnosis not present

## 2019-07-21 DIAGNOSIS — M5412 Radiculopathy, cervical region: Secondary | ICD-10-CM | POA: Diagnosis not present

## 2019-07-21 DIAGNOSIS — Z20822 Contact with and (suspected) exposure to covid-19: Secondary | ICD-10-CM | POA: Diagnosis present

## 2019-07-21 DIAGNOSIS — L7632 Postprocedural hematoma of skin and subcutaneous tissue following other procedure: Secondary | ICD-10-CM | POA: Diagnosis present

## 2019-07-21 DIAGNOSIS — R0602 Shortness of breath: Secondary | ICD-10-CM | POA: Diagnosis not present

## 2019-07-21 DIAGNOSIS — K219 Gastro-esophageal reflux disease without esophagitis: Secondary | ICD-10-CM | POA: Diagnosis present

## 2019-07-21 DIAGNOSIS — M4712 Other spondylosis with myelopathy, cervical region: Secondary | ICD-10-CM | POA: Diagnosis present

## 2019-07-21 DIAGNOSIS — Z8249 Family history of ischemic heart disease and other diseases of the circulatory system: Secondary | ICD-10-CM | POA: Diagnosis not present

## 2019-07-21 DIAGNOSIS — M4802 Spinal stenosis, cervical region: Secondary | ICD-10-CM | POA: Diagnosis present

## 2019-07-21 DIAGNOSIS — J45909 Unspecified asthma, uncomplicated: Secondary | ICD-10-CM | POA: Diagnosis not present

## 2019-07-21 DIAGNOSIS — F419 Anxiety disorder, unspecified: Secondary | ICD-10-CM | POA: Diagnosis present

## 2019-07-21 DIAGNOSIS — S1093XA Contusion of unspecified part of neck, initial encounter: Secondary | ICD-10-CM | POA: Diagnosis not present

## 2019-07-21 DIAGNOSIS — J988 Other specified respiratory disorders: Secondary | ICD-10-CM | POA: Diagnosis present

## 2019-07-21 DIAGNOSIS — Y838 Other surgical procedures as the cause of abnormal reaction of the patient, or of later complication, without mention of misadventure at the time of the procedure: Secondary | ICD-10-CM | POA: Diagnosis present

## 2019-07-21 DIAGNOSIS — J9811 Atelectasis: Secondary | ICD-10-CM | POA: Diagnosis not present

## 2019-07-21 LAB — BASIC METABOLIC PANEL
Anion gap: 9 (ref 5–15)
BUN: 11 mg/dL (ref 8–23)
CO2: 24 mmol/L (ref 22–32)
Calcium: 8.6 mg/dL — ABNORMAL LOW (ref 8.9–10.3)
Chloride: 102 mmol/L (ref 98–111)
Creatinine, Ser: 0.85 mg/dL (ref 0.44–1.00)
GFR calc Af Amer: >60 mL/min (ref >60)
GFR calc non Af Amer: >60 mL/min (ref >60)
Glucose, Bld: 113 mg/dL — ABNORMAL HIGH (ref 70–99)
Potassium: 4 mmol/L (ref 3.5–5.1)
Sodium: 135 mmol/L (ref 135–145)

## 2019-07-21 LAB — CBC
HCT: 35.5 % — ABNORMAL LOW (ref 36.0–46.0)
Hemoglobin: 11 g/dL — ABNORMAL LOW (ref 12.0–15.0)
MCH: 26.3 pg (ref 26.0–34.0)
MCHC: 31 g/dL (ref 30.0–36.0)
MCV: 84.7 fL (ref 80.0–100.0)
Platelets: 192 10*3/uL (ref 150–400)
RBC: 4.19 MIL/uL (ref 3.87–5.11)
RDW: 14.7 % (ref 11.5–15.5)
WBC: 14.1 10*3/uL — ABNORMAL HIGH (ref 4.0–10.5)
nRBC: 0 % (ref 0.0–0.2)

## 2019-07-21 LAB — SARS CORONAVIRUS 2 BY RT PCR (HOSPITAL ORDER, PERFORMED IN ~~LOC~~ HOSPITAL LAB): SARS Coronavirus 2: NEGATIVE

## 2019-07-21 LAB — TROPONIN I (HIGH SENSITIVITY)
Troponin I (High Sensitivity): 7 ng/L (ref <18)
Troponin I (High Sensitivity): 6 ng/L (ref <18)

## 2019-07-21 MED ORDER — OXYCODONE-ACETAMINOPHEN 5-325 MG PO TABS: 2.0000 | ORAL_TABLET | Freq: Four times a day (QID) | ORAL | 0 refills | Status: DC | PRN

## 2019-07-21 MED ORDER — OLANZAPINE 2.5 MG PO TABS
5.0000 mg | ORAL_TABLET | Freq: Every day | ORAL | Status: DC
  Administered 2019-07-22 (×2): 5 mg via ORAL
  Filled 2019-07-21: qty 1
  Filled 2019-07-21: qty 2

## 2019-07-21 MED ORDER — ONDANSETRON HCL 4 MG PO TABS: 4.0000 mg | ORAL_TABLET | Freq: Four times a day (QID) | ORAL | Status: DC | PRN

## 2019-07-21 MED ORDER — ALBUTEROL SULFATE (2.5 MG/3ML) 0.083% IN NEBU: 2.5000 mg | INHALATION_SOLUTION | Freq: Four times a day (QID) | RESPIRATORY_TRACT | Status: DC | PRN

## 2019-07-21 MED ORDER — MONTELUKAST SODIUM 10 MG PO TABS
10.0000 mg | ORAL_TABLET | Freq: Every day | ORAL | Status: DC
  Administered 2019-07-22: 10 mg via ORAL
  Filled 2019-07-21: qty 1

## 2019-07-21 MED ORDER — OXYCODONE-ACETAMINOPHEN 5-325 MG PO TABS
1.0000 | ORAL_TABLET | ORAL | Status: DC | PRN
  Administered 2019-07-22 – 2019-07-23 (×2): 1 via ORAL
  Filled 2019-07-21 (×2): qty 1

## 2019-07-21 MED ORDER — ONDANSETRON HCL 4 MG/2ML IJ SOLN: 4.0000 mg | Freq: Four times a day (QID) | INTRAMUSCULAR | Status: DC | PRN

## 2019-07-21 MED ORDER — SODIUM CHLORIDE 0.9% FLUSH: 3.0000 mL | INTRAVENOUS | Status: DC | PRN

## 2019-07-21 MED ORDER — ALBUTEROL SULFATE HFA 108 (90 BASE) MCG/ACT IN AERS: 2.0000 | INHALATION_SPRAY | Freq: Four times a day (QID) | RESPIRATORY_TRACT | Status: DC | PRN

## 2019-07-21 MED ORDER — FLUTICASONE PROPIONATE 50 MCG/ACT NA SUSP: 1.0000 | NASAL | Status: DC | PRN

## 2019-07-21 MED ORDER — AMLODIPINE BESYLATE 5 MG PO TABS
5.0000 mg | ORAL_TABLET | Freq: Every day | ORAL | Status: DC
  Administered 2019-07-22: 5 mg via ORAL
  Filled 2019-07-21: qty 1

## 2019-07-21 MED ORDER — IOHEXOL 300 MG/ML  SOLN
75.0000 mL | Freq: Once | INTRAMUSCULAR | Status: AC | PRN
  Administered 2019-07-21: 75 mL via INTRAVENOUS

## 2019-07-21 MED ORDER — GABAPENTIN 300 MG PO CAPS
300.0000 mg | ORAL_CAPSULE | Freq: Three times a day (TID) | ORAL | Status: DC
  Administered 2019-07-21 – 2019-07-23 (×5): 300 mg via ORAL
  Filled 2019-07-21 (×3): qty 1
  Filled 2019-07-21: qty 3
  Filled 2019-07-21: qty 1

## 2019-07-21 MED ORDER — SODIUM CHLORIDE 0.9% FLUSH: 3.0000 mL | Freq: Once | INTRAVENOUS | Status: DC

## 2019-07-21 MED ORDER — DEXAMETHASONE SODIUM PHOSPHATE 10 MG/ML IJ SOLN
10.0000 mg | Freq: Four times a day (QID) | INTRAMUSCULAR | Status: DC
  Administered 2019-07-21 – 2019-07-23 (×5): 10 mg via INTRAVENOUS
  Filled 2019-07-21 (×5): qty 1

## 2019-07-21 MED ORDER — METHOCARBAMOL 500 MG PO TABS
500.0000 mg | ORAL_TABLET | Freq: Four times a day (QID) | ORAL | Status: DC
  Administered 2019-07-21 – 2019-07-23 (×6): 500 mg via ORAL
  Filled 2019-07-21 (×6): qty 1

## 2019-07-21 MED ORDER — FAMOTIDINE 20 MG PO TABS
40.0000 mg | ORAL_TABLET | Freq: Every day | ORAL | Status: DC
  Administered 2019-07-21 – 2019-07-23 (×3): 40 mg via ORAL
  Filled 2019-07-21: qty 4
  Filled 2019-07-21 (×2): qty 2

## 2019-07-21 MED ORDER — PRAVASTATIN SODIUM 40 MG PO TABS
40.0000 mg | ORAL_TABLET | Freq: Every day | ORAL | Status: DC
  Administered 2019-07-21 – 2019-07-22 (×2): 40 mg via ORAL
  Filled 2019-07-21 (×2): qty 1

## 2019-07-21 MED ORDER — SODIUM CHLORIDE 0.9% FLUSH
3.0000 mL | Freq: Two times a day (BID) | INTRAVENOUS | Status: DC
  Administered 2019-07-22 – 2019-07-23 (×3): 3 mL via INTRAVENOUS

## 2019-07-21 MED ORDER — BENAZEPRIL HCL 20 MG PO TABS
40.0000 mg | ORAL_TABLET | Freq: Every day | ORAL | Status: DC
  Administered 2019-07-22 (×2): 40 mg via ORAL
  Filled 2019-07-21: qty 2
  Filled 2019-07-21: qty 1

## 2019-07-21 MED ORDER — LORATADINE 10 MG PO TABS
10.0000 mg | ORAL_TABLET | Freq: Every day | ORAL | Status: DC
  Administered 2019-07-21 – 2019-07-23 (×3): 10 mg via ORAL
  Filled 2019-07-21 (×3): qty 1

## 2019-07-21 MED ORDER — SODIUM CHLORIDE 0.9 % IV SOLN: 250.0000 mL | INTRAVENOUS | Status: DC | PRN

## 2019-07-21 MED ORDER — CLINDAMYCIN PHOSPHATE 900 MG/50ML IV SOLN
900.0000 mg | Freq: Once | INTRAVENOUS | Status: AC
  Administered 2019-07-21: 900 mg via INTRAVENOUS
  Filled 2019-07-21: qty 50

## 2019-07-21 MED ORDER — BUSPIRONE HCL 10 MG PO TABS: 30.0000 mg | ORAL_TABLET | Freq: Every day | ORAL | Status: DC | PRN

## 2019-07-21 MED ORDER — AMLODIPINE BESY-BENAZEPRIL HCL 5-40 MG PO CAPS: 1.0000 | ORAL_CAPSULE | Freq: Every evening | ORAL | Status: DC

## 2019-07-21 NOTE — H&P (Signed)
Kysha Patman is an 69 y.o. female.   Chief Complaint: Neck swelling HPI: 69 year old female 3 days status post multilevel anterior cervical decompression and fusion presents with some increased neck swelling and some difficulty breathing and swallowing.  Overall the patient has been doing well with minimal neck pain.  She is having no upper extremity symptoms.  She is ambulating without difficulty.  She notes some mild shortness of breath and some occasional stridor.  She is still able to swallow medicines and eat and drink.  She is handling her secretions well.  She is afebrile.  Her vital signs are stable.  She is oxygenating well.  Past Medical History:  Diagnosis Date  . Anemia   . Anxiety   . Arthritis   . Asthma   . Cancer ([place]) 2002   Squamous Carcinoma  . Depression   . GERD (gastroesophageal reflux disease)   . Heart murmur    Mitral Valve prolapse  . History of kidney stones   . Hypertension   . Pneumonia   . PONV (postoperative nausea and vomiting) 2003   Too much anesthesia; hard to wake up/coma; N/V  . Pre-diabetes     Past Surgical History:  Procedure Laterality Date  . ABDOMINAL HYSTERECTOMY    . EYE SURGERY  2020   Cataract lens replacement  . FRACTURE SURGERY Left 2003   Humerous fracture  . JOINT REPLACEMENT     right and left knee replacement  . kidney stone removed    . TONSILLECTOMY    . TOTAL KNEE REVISION Right 05/30/2016   Procedure: TOTAL KNEE REVISION;  Surgeon: Vickey Huger, MD;  Location: [facility];  Service: Orthopedics;  Laterality: Right;  . WRIST ARTHROPLASTY Left     Family History  Problem Relation Age of Onset  . Heart attack Brother    Social History:  reports that she has never smoked. She has never used smokeless tobacco. She reports that she does not drink alcohol or use drugs.  Allergies: No Known Allergies  (Not in a hospital admission)   Results for orders placed or performed during the hospital encounter of 07/21/19 (from the  past 48 hour(s))  Basic metabolic panel     Status: Abnormal   Collection Time: 07/21/19  4:30 PM  Result Value Ref Range   Sodium 135 135 - 145 mmol/L   Potassium 4.0 3.5 - 5.1 mmol/L   Chloride 102 98 - 111 mmol/L   CO2 24 22 - 32 mmol/L   Glucose, Bld 113 (H) 70 - 99 mg/dL    Comment: Glucose reference range applies only to samples taken after fasting for at least 8 hours.   BUN 11 8 - 23 mg/dL   Creatinine, Ser 0.85 0.44 - 1.00 mg/dL   Calcium 8.6 (L) 8.9 - 10.3 mg/dL   GFR calc non Af Amer >60 >60 mL/min   GFR calc Af Amer >60 >60 mL/min   Anion gap 9 5 - 15    Comment: Performed at [facility] [address] [place], [place] [address]  CBC     Status: Abnormal   Collection Time: 07/21/19  4:30 PM  Result Value Ref Range   WBC 14.1 (H) 4.0 - 10.5 K/uL   RBC 4.19 3.87 - 5.11 MIL/uL   Hemoglobin 11.0 (L) 12.0 - 15.0 g/dL   HCT 35.5 (L) 36.0 - 46.0 %   MCV 84.7 80.0 - 100.0 fL   MCH 26.3 26.0 - 34.0 pg  MCHC 31.0 30.0 - 36.0 g/dL   RDW 14.7 11.5 - 15.5 %   Platelets 192 150 - 400 K/uL   nRBC 0.0 0.0 - 0.2 %    Comment: Performed at [facility], [place] [address] [place], [place] [address]  Troponin I (High Sensitivity)     Status: None   Collection Time: 07/21/19  4:30 PM  Result Value Ref Range   Troponin I (High Sensitivity) 7 <18 ng/L    Comment: (NOTE) Elevated high sensitivity troponin I (hsTnI) values and significant  changes across serial measurements may suggest ACS but many other  chronic and acute conditions are known to elevate hsTnI results.  Refer to the "Links" section for chest pain algorithms and additional  guidance. Performed at [facility], [place] [address] [place], [place] [address]   Troponin I (High Sensitivity)     Status: None   Collection Time: 07/21/19  7:49 PM  Result Value Ref Range   Troponin I (High Sensitivity) 6 <18 ng/L    Comment: (NOTE) Elevated high sensitivity troponin I (hsTnI) values and significant   changes across serial measurements may suggest ACS but many other  chronic and acute conditions are known to elevate hsTnI results.  Refer to the "Links" section for chest pain algorithms and additional  guidance. Performed at [facility], [place] [address], [place],  [address]    DG Chest 2 View  Result Date: 07/21/2019 CLINICAL DATA:  Shortness of breath. Recent cervical laminectomy. EXAM: CHEST - 2 VIEW COMPARISON:  Radiograph 11/06/2014. FINDINGS: Low lung volumes.The cardiomediastinal contours are normal. Minor bibasilar atelectasis. Pulmonary vasculature is normal. No consolidation, pleural effusion, or pneumothorax. No acute osseous abnormalities are seen. Anterior cervical fusion is partially included. Air in the soft tissues in the right neck adjacent to the surgical hardware may be postsurgical. IMPRESSION: Low lung volumes with mild bibasilar atelectasis. Electronically Signed   By: Keith Rake M.D.   On: 07/21/2019 17:16   CT Soft Tissue Neck W Contrast  Result Date: 07/21/2019 CLINICAL DATA:  Stridor following cervical spine surgery. EXAM: CT NECK WITH CONTRAST TECHNIQUE: Multidetector CT imaging of the neck was performed using the standard protocol following the bolus administration of intravenous contrast. CONTRAST:  85mL OMNIPAQUE IOHEXOL 300 MG/ML  SOLN COMPARISON:  None. FINDINGS: Pharynx and larynx: Status post C4-7 ACDF. There is a gas and fluid collection at the C3-4 levels that measures 1.5 x 1.7. This causes mass effect on the supraglottic larynx. There is mild narrowing of the upper laryngeal airway. The collection extends beyond the inferior aspect of the fused levels measures approximately 2.7 x 2.1 cm and exerts mass effect on the esophagus and trachea at the thoracic inlet. This is where the greatest degree of airway narrowing is. AP diameter of the trachea measures 3 mm here. Salivary glands: No inflammation, mass, or stone. Thyroid: Normal. Lymph nodes:  None enlarged or abnormal density. Vascular: Negative. Limited intracranial: Negative. Visualized orbits: Negative. Mastoids and visualized paranasal sinuses: Clear. Skeleton: C4-7 ACDF Upper chest: Negative. Other: There is a superficial hematoma within the anterior right neck that measures 4.3 x 2.1 cm. IMPRESSION: 1. Status post C4-7 ACDF with postoperative prevertebral fluid collections at the C3-4 level and extending to the thoracic inlet. The thoracic inlet collection causes moderate mass effect on the esophagus and trachea with severe airway narrowing. 2. Superficial hematoma within the anterior right neck. Critical Value/emergent results were called by telephone at the time of  interpretation on 07/21/2019 at 8:58 pm to provider MATTHEW TRIFAN , who verbally acknowledged these results. Electronically Signed   By: Ulyses Jarred M.D.   On: 07/21/2019 21:00   CT CHEST W CONTRAST  Result Date: 07/21/2019 CLINICAL DATA:  Cough persistent post surgery with elevated white blood cell count and atelectasis on recent chest x-ray EXAM: CT CHEST WITH CONTRAST TECHNIQUE: Multidetector CT imaging of the chest was performed during intravenous contrast administration. CONTRAST:  25mL OMNIPAQUE IOHEXOL 300 MG/ML  SOLN COMPARISON:  11/05/2008, chest x-ray from 07/21/2019 FINDINGS: Cardiovascular: Heart size is normal. No pericardial effusion. Aorta is normal caliber. Central pulmonary arteries are normal on venous phase imaging. Mediastinum/Nodes: Ovoid collection anterior to RIGHT sternocleidomastoid muscle in the subcutaneous tissues of the RIGHT anterior neck measures 4.5 x 2.4 cm at 34 Hounsfield units. Gas tracking into the RIGHT neck posterior to the larynx following anterior spinal fusion. Ovoid low-density displaces the esophagus anteriorly at the lower margin of the cervical fusion. Stranding extends into the superior mediastinum obscuring the upper portion of the esophagus nearly completely likely displacing it  anteriorly. This is better demonstrated on the neck CT. No axillary lymphadenopathy. No significant stranding extending beyond the thoracic inlet. No gas extending beyond the thoracic inlet and superior mediastinum. Lungs/Pleura: Basilar atelectasis. No dense consolidation or pleural effusion. Airways are patent. No pneumothorax. Upper Abdomen: Incidental imaging of upper abdominal contents shows lobular hepatic margins. This raises the question of liver disease. No acute upper abdominal process. Musculoskeletal: Stranding tracks along the anterior chest wall. No acute musculoskeletal process. Postoperative changes of spinal fusion. IMPRESSION: 1. Ovoid collection anterior to RIGHT sternocleidomastoid muscle in the subcutaneous tissues of the RIGHT anterior neck measures 4.5 x 2.4 cm. Stranding extends into the superior mediastinum obscuring the upper portion of the esophagus nearly completely likely displacing it anteriorly. This is better demonstrated on the neck CT. 2. Ovoid area of low density along the inferior margin of the spinal fusion, see dedicated neck CT for further detail. Stranding extends into the superior mediastinum from the low neck. Findings may all be postoperative though infection is considered 3. Basilar atelectasis. 4. Incidental imaging of upper abdominal contents shows lobular hepatic margins. This raises the question of liver disease. Electronically Signed   By: Zetta Bills M.D.   On: 07/21/2019 20:59    Pertinent items noted in HPI and remainder of comprehensive ROS otherwise negative.  Blood pressure (!) 158/71, pulse 89, temperature 98 F (36.7 C), temperature source Oral, resp. rate 20, SpO2 95 %.  Patient is awake and alert.  She is oriented and appropriate.  Speech is fluent.  Judgment insight are intact.  Her cranial nerve function is normal bilaterally.  Motor and sensory function extremities is normal.  Examination her head ears eyes nose and throat is unremarked.  Chest  and abdomen are recently benign.  Extremities are free of major deformity.  Her wound is swollen.  There are some degree of superficial hematoma but overall the superficial compartment is not tense.  Her airway is midline.  Her skin moves freely.  The area is not particularly tender.  Most of all the patient does not appear to be any true distress at present. Assessment/Plan Patient status post multilevel anterior cervical decompression and fusion with some anterior and deep compartment hemorrhage with some airway constriction.  Overall situation is not to the point where I would think that reexploration of her wound is needed or appropriate.  Plan to admit and observe closely.  Plan to place IV steroids and some prophylactic antibiotics.  Reassess the situation in the morning.  If patient situation worsened at all then consider reexploring her neck with evacuation of hemorrhage.  Cooper Render Cruzita Lipa 07/21/2019, 9:31 PM

## 2019-07-21 NOTE — ED Triage Notes (Signed)
Pt c/o shortness of breath with exertion following a cervical laminectomy on 5/14. Also c/o pain to surgical site.

## 2019-07-21 NOTE — ED Notes (Signed)
Pt went to c-t 

## 2019-07-21 NOTE — ED Provider Notes (Signed)
[facility] EMERGENCY DEPARTMENT Provider Note   CSN: [id number] Arrival date & time: 07/21/19  1546     History Chief Complaint  Patient presents with  . Shortness of Breath    [name] is a 69 y.o. female who underwent anterior ACDF of the cervical spine with Dr. Trenton Gammon 2 days ago (07/19/19) in the hospital, discharge from the hospital yesterday, presenting back to ED with shortness of breath.  Patient is concerned that she has an enlarging hematoma on the right side of her neck, anteriorly, where they did the anterior surgical approach.  She feels that she is been having difficulty breathing.  She contacted the nurse at the neurosurgery clinic and was told to come to the emergency department.  She denies fevers or chills.  She does not wear oxygen at home.  She has no pulmonary issues.  Has no smoking history.  She does feel like there is redness expanding around her anterior surgical wound site.  HPI     Past Medical History:  Diagnosis Date  . Anemia   . Anxiety   . Arthritis   . Asthma   . Cancer ([place]) 2002   Squamous Carcinoma  . Depression   . GERD (gastroesophageal reflux disease)   . Heart murmur    Mitral Valve prolapse  . History of kidney stones   . Hypertension   . Pneumonia   . PONV (postoperative nausea and vomiting) 2003   Too much anesthesia; hard to wake up/coma; N/V  . Pre-diabetes     Patient Active Problem List   Diagnosis Date Noted  . Airway compromise 07/21/2019  . Cervical spondylosis with myelopathy and radiculopathy 07/19/2019  . UTI (urinary tract infection) 12/28/2017  . Microscopic hematuria 12/28/2017  . Normocytic anemia 12/28/2017  . Osteoporosis 12/28/2017  . Sacral insufficiency fracture with delayed healing 12/27/2017  . 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 Essential hypertension 11/21/2014  . Asthma 11/12/2014  . Allergic rhinoconjunctivitis 11/12/2014  . GERD (gastroesophageal reflux disease)  11/12/2014    Past Surgical History:  Procedure Laterality Date  . ABDOMINAL HYSTERECTOMY    . EYE SURGERY  2020   Cataract lens replacement  . FRACTURE SURGERY Left 2003   Humerous fracture  . JOINT REPLACEMENT     right and left knee replacement  . kidney stone removed    . TONSILLECTOMY    . TOTAL KNEE REVISION Right 05/30/2016   Procedure: TOTAL KNEE REVISION;  Surgeon: Vickey Huger, MD;  Location: [facility];  Service: Orthopedics;  Laterality: Right;  . WRIST ARTHROPLASTY Left      OB History   No obstetric history on file.     Family History  Problem Relation Age of Onset  . Heart attack Brother     Social History   Tobacco Use  . Smoking status: Never Smoker  . Smokeless tobacco: Never Used  Substance Use Topics  . Alcohol use: No  . Drug use: No    Home Medications Prior to Admission medications   Medication Sig Start Date End Date Taking? Authorizing Provider  albuterol (PROVENTIL HFA;VENTOLIN HFA) 108 (90 BASE) MCG/ACT inhaler Inhale 2 puffs into the lungs every 6 (six) hours as needed for wheezing or shortness of breath.   Yes [provider]  ALPRAZolam Duanne Moron) 0.5 MG tablet Take 0.5 mg by mouth at bedtime.   Yes [provider]  amLODipine-benazepril (LOTREL) 5-40 MG capsule Take 1 capsule by mouth at bedtime.  Yes [provider]  busPIRone (BUSPAR) 15 MG tablet Take 15 mg by mouth 2 (two) times daily as needed (anxiety).   Yes [provider]  Calcium Citrate-Vitamin D (CITRACAL + D PO) Take 400 mg by mouth daily with supper.   Yes [provider]  denosumab (PROLIA) 60 MG/ML SOLN injection Inject 60 mg into the skin every 6 (six) months.   Yes [provider]  EPINEPHrine 0.3 mg/0.3 mL IJ SOAJ injection Inject 0.3 mg into the muscle once as needed for anaphylaxis.    Yes [provider]  famotidine (PEPCID) 40 MG tablet Take 40 mg by mouth at bedtime.  07/11/19  Yes [provider]    fexofenadine (ALLEGRA) 180 MG tablet TAKE 1 TABLET DAILY Patient taking differently: Take 180 mg by mouth daily with supper.  09/03/15  Yes Kozlow, Donnamarie Poag, MD  fluticasone (FLONASE) 50 MCG/ACT nasal spray Place 1 spray into both nostrils as needed for allergies or rhinitis. 01/26/15  Yes Kozlow, Donnamarie Poag, MD  gabapentin (NEURONTIN) 300 MG capsule Take 300 mg by mouth 2 (two) times daily as needed (nerve pain).  07/13/19  Yes [provider]  methocarbamol (ROBAXIN) 500 MG tablet Take 1 tablet (500 mg total) by mouth 4 (four) times daily. Patient taking differently: Take 500 mg by mouth 4 (four) times daily as needed for muscle spasms.  07/20/19  Yes Meyran, Ocie Cornfield, NP  montelukast (SINGULAIR) 10 MG tablet Take 10 mg by mouth daily with supper.    Yes [provider]  OLANZapine (ZYPREXA) 5 MG tablet Take 5 mg by mouth at bedtime.   Yes [provider]  Olopatadine HCl (PATADAY OP) Place 1 drop into both eyes daily.   Yes [provider]  oxyCODONE-acetaminophen (PERCOCET) 5-325 MG tablet Take 1 tablet by mouth every 4 (four) hours as needed for severe pain. 07/20/19 07/19/20 Yes Meyran, Ocie Cornfield, NP  polyvinyl alcohol (ARTIFICIAL TEARS) 1.4 % ophthalmic solution Place 1 drop into both eyes 2 (two) times daily.   Yes [provider]  pravastatin (PRAVACHOL) 40 MG tablet Take 40 mg by mouth at bedtime.   Yes [provider]  methocarbamol (ROBAXIN) 500 MG tablet Take 1-2 tablets (500-1,000 mg total) by mouth every 6 (six) hours as needed for muscle spasms. Patient not taking: Reported on 07/21/2019 05/31/16   Donia Ast, [place]    Allergies    Patient has no known allergies.  Review of Systems   Review of Systems  Constitutional: Negative for chills and fever.  HENT: Positive for trouble swallowing and voice change.   Eyes: Negative for photophobia and visual disturbance.  Respiratory: Positive for shortness of breath, wheezing  and stridor.   Cardiovascular: Negative for chest pain and palpitations.  Gastrointestinal: Negative for abdominal pain and vomiting.  Musculoskeletal: Positive for back pain and neck pain.  Skin: Positive for rash and wound.  Neurological: Negative for syncope and headaches.  Psychiatric/Behavioral: Negative for agitation and confusion.  All other systems reviewed and are negative.   Physical Exam Updated Vital Signs BP 114/68   Pulse 85   Temp 98 F (36.7 C) (Oral)   Resp 16   SpO2 98%   Physical Exam Vitals and nursing note reviewed.  Constitutional:      General: She is not in acute distress.    Appearance: She is well-developed.  HENT:     Head: Normocephalic and atraumatic.  Eyes:     Conjunctiva/sclera: Conjunctivae normal.  Neck:     Comments:  Surgical wound on anterior right side of neck with mild erythema, surrounding redness of the skin, no purulence; Golf-ball sized hematoma of the right anterior neck;  Cardiovascular:     Rate and Rhythm: Normal rate and regular rhythm.  Pulmonary:     Effort: Pulmonary effort is normal. No respiratory distress.     Comments: No audible stridor from afar, +inspiratory stridor with ausculation of the neck; voice is clear; speaking clearly; 97% room air Abdominal:     Palpations: Abdomen is soft.     Tenderness: There is no abdominal tenderness.  Musculoskeletal:     Cervical back: Neck supple.  Skin:    General: Skin is warm and dry.  Neurological:     General: No focal deficit present.     Mental Status: She is alert and oriented to person, place, and time.     ED Results / Procedures / Treatments   Labs (all labs ordered are listed, but only abnormal results are displayed) Labs Reviewed  BASIC METABOLIC PANEL - Abnormal; Notable for the following components:      Result Value   Glucose, Bld 113 (*)    Calcium 8.6 (*)    All other components within normal limits  CBC - Abnormal; Notable for the following  components:   WBC 14.1 (*)    Hemoglobin 11.0 (*)    HCT 35.5 (*)    All other components within normal limits  SARS CORONAVIRUS 2 BY RT PCR (HOSPITAL ORDER, [facility])  HIV ANTIBODY (ROUTINE TESTING W REFLEX)  TROPONIN I (HIGH SENSITIVITY)  TROPONIN I (HIGH SENSITIVITY)    EKG EKG Interpretation  Date/Time:  Sunday Jul 21 2019 16:15:45 EDT Ventricular Rate:  83 PR Interval:  130 QRS Duration: 82 QT Interval:  394 QTC Calculation: 462 R Axis:   52 Text Interpretation: Normal sinus rhythm Biatrial enlargement Abnormal ECG No STEMI Confirmed by Octaviano Glow [phone]) on 07/21/2019 6:14:10 PM   Radiology DG Chest 2 View  Result Date: 07/21/2019 CLINICAL DATA:  Shortness of breath. Recent cervical laminectomy. EXAM: CHEST - 2 VIEW COMPARISON:  Radiograph 11/06/2014. FINDINGS: Low lung volumes.The cardiomediastinal contours are normal. Minor bibasilar atelectasis. Pulmonary vasculature is normal. No consolidation, pleural effusion, or pneumothorax. No acute osseous abnormalities are seen. Anterior cervical fusion is partially included. Air in the soft tissues in the right neck adjacent to the surgical hardware may be postsurgical. IMPRESSION: Low lung volumes with mild bibasilar atelectasis. Electronically Signed   By: Keith Rake M.D.   On: 07/21/2019 17:16   CT Soft Tissue Neck W Contrast  Result Date: 07/21/2019 CLINICAL DATA:  Stridor following cervical spine surgery. EXAM: CT NECK WITH CONTRAST TECHNIQUE: Multidetector CT imaging of the neck was performed using the standard protocol following the bolus administration of intravenous contrast. CONTRAST:  72mL OMNIPAQUE IOHEXOL 300 MG/ML  SOLN COMPARISON:  None. FINDINGS: Pharynx and larynx: Status post C4-7 ACDF. There is a gas and fluid collection at the C3-4 levels that measures 1.5 x 1.7. This causes mass effect on the supraglottic larynx. There is mild narrowing of the upper laryngeal airway. The  collection extends beyond the inferior aspect of the fused levels measures approximately 2.7 x 2.1 cm and exerts mass effect on the esophagus and trachea at the thoracic inlet. This is where the greatest degree of airway narrowing is. AP diameter of the trachea measures 3 mm here. Salivary glands: No inflammation, mass, or stone.  Thyroid: Normal. Lymph nodes: None enlarged or abnormal density. Vascular: Negative. Limited intracranial: Negative. Visualized orbits: Negative. Mastoids and visualized paranasal sinuses: Clear. Skeleton: C4-7 ACDF Upper chest: Negative. Other: There is a superficial hematoma within the anterior right neck that measures 4.3 x 2.1 cm. IMPRESSION: 1. Status post C4-7 ACDF with postoperative prevertebral fluid collections at the C3-4 level and extending to the thoracic inlet. The thoracic inlet collection causes moderate mass effect on the esophagus and trachea with severe airway narrowing. 2. Superficial hematoma within the anterior right neck. Critical Value/emergent results were called by telephone at the time of interpretation on 07/21/2019 at 8:58 pm to provider Emmie Frakes , who verbally acknowledged these results. Electronically Signed   By: Ulyses Jarred M.D.   On: 07/21/2019 21:00   CT CHEST W CONTRAST  Result Date: 07/21/2019 CLINICAL DATA:  Cough persistent post surgery with elevated white blood cell count and atelectasis on recent chest x-ray EXAM: CT CHEST WITH CONTRAST TECHNIQUE: Multidetector CT imaging of the chest was performed during intravenous contrast administration. CONTRAST:  41mL OMNIPAQUE IOHEXOL 300 MG/ML  SOLN COMPARISON:  11/05/2008, chest x-ray from 07/21/2019 FINDINGS: Cardiovascular: Heart size is normal. No pericardial effusion. Aorta is normal caliber. Central pulmonary arteries are normal on venous phase imaging. Mediastinum/Nodes: Ovoid collection anterior to RIGHT sternocleidomastoid muscle in the subcutaneous tissues of the RIGHT anterior neck  measures 4.5 x 2.4 cm at 34 Hounsfield units. Gas tracking into the RIGHT neck posterior to the larynx following anterior spinal fusion. Ovoid low-density displaces the esophagus anteriorly at the lower margin of the cervical fusion. Stranding extends into the superior mediastinum obscuring the upper portion of the esophagus nearly completely likely displacing it anteriorly. This is better demonstrated on the neck CT. No axillary lymphadenopathy. No significant stranding extending beyond the thoracic inlet. No gas extending beyond the thoracic inlet and superior mediastinum. Lungs/Pleura: Basilar atelectasis. No dense consolidation or pleural effusion. Airways are patent. No pneumothorax. Upper Abdomen: Incidental imaging of upper abdominal contents shows lobular hepatic margins. This raises the question of liver disease. No acute upper abdominal process. Musculoskeletal: Stranding tracks along the anterior chest wall. No acute musculoskeletal process. Postoperative changes of spinal fusion. IMPRESSION: 1. Ovoid collection anterior to RIGHT sternocleidomastoid muscle in the subcutaneous tissues of the RIGHT anterior neck measures 4.5 x 2.4 cm. Stranding extends into the superior mediastinum obscuring the upper portion of the esophagus nearly completely likely displacing it anteriorly. This is better demonstrated on the neck CT. 2. Ovoid area of low density along the inferior margin of the spinal fusion, see dedicated neck CT for further detail. Stranding extends into the superior mediastinum from the low neck. Findings may all be postoperative though infection is considered 3. Basilar atelectasis. 4. Incidental imaging of upper abdominal contents shows lobular hepatic margins. This raises the question of liver disease. Electronically Signed   By: Zetta Bills M.D.   On: 07/21/2019 20:59    Procedures Procedures (including critical care time)  Medications Ordered in ED Medications  sodium chloride flush  (NS) 0.9 % injection 3 mL (3 mLs Intravenous Not Given 07/21/19 1832)  oxyCODONE-acetaminophen (PERCOCET/ROXICET) 5-325 MG per tablet 1 tablet (has no administration in time range)  pravastatin (PRAVACHOL) tablet 40 mg (40 mg Oral Given 07/21/19 2247)  busPIRone (BUSPAR) tablet 30 mg (has no administration in time range)  OLANZapine (ZYPREXA) tablet 5 mg (has no administration in time range)  famotidine (PEPCID) tablet 40 mg (40 mg Oral Given 07/21/19 2247)  gabapentin (NEURONTIN)  capsule 300 mg (has no administration in time range)  methocarbamol (ROBAXIN) tablet 500 mg (500 mg Oral Given 07/21/19 2247)  albuterol (PROVENTIL) (2.5 MG/3ML) 0.083% nebulizer solution 2.5 mg (has no administration in time range)  loratadine (CLARITIN) tablet 10 mg (10 mg Oral Given 07/21/19 2247)  fluticasone (FLONASE) 50 MCG/ACT nasal spray 1 spray (has no administration in time range)  montelukast (SINGULAIR) tablet 10 mg (has no administration in time range)  sodium chloride flush (NS) 0.9 % injection 3 mL (has no administration in time range)  sodium chloride flush (NS) 0.9 % injection 3 mL (has no administration in time range)  0.9 %  sodium chloride infusion (has no administration in time range)  ondansetron (ZOFRAN) tablet 4 mg (has no administration in time range)    Or  ondansetron (ZOFRAN) injection 4 mg (has no administration in time range)  dexamethasone (DECADRON) injection 10 mg (10 mg Intravenous Given 07/21/19 2248)  amLODipine (NORVASC) tablet 5 mg (has no administration in time range)    And  benazepril (LOTENSIN) tablet 40 mg (has no administration in time range)  iohexol (OMNIPAQUE) 300 MG/ML solution 75 mL (75 mLs Intravenous Contrast Given 07/21/19 2028)  clindamycin (CLEOCIN) IVPB 900 mg (0 mg Intravenous Stopped 07/21/19 2212)    ED Course  I have reviewed the triage vital signs and the nursing notes.  Pertinent labs & imaging results that were available during my care of the patient were  reviewed by me and considered in my medical decision making (see chart for details).  69 year old female presented emergency department postoperative 2 days after an anterior approach to C3-C7 cervical spinal fusion with Dr. Trenton Gammon.  Patient reporting shortness of breath since going home yesterday.  She believes that her hematoma is expanding.  She feels like it is trapping her airway.  She is a clear voice on exam.  There is inspiratory stridor with auscultation of the neck.  The hematoma is approximately golf ball sized.    I ordered a CT soft tissue of the neck and CT chest (to evaluate elevated WBC and atelectasis seen in xray chest).  No visible PNA, but CT neck reading as noted, with some compression of the trachea near the thoracic inlet.    Skin is erythematous, ordered IV clindamycin Spoke to NSGY PA bergman as noted below, awaiting call back from Dr. Trenton Gammon Continue to closely monitor patient's airway at this time.  Clinical Course as of Jul 20 2300  Sun Jul 21, 2019  2004 I spoke to [place] provider with Dr Irven [place] service, she agreed with CT soft tissue of the neck, will discuss with them afterwards   [MT]  2100 The radiologist Dr Collins [place] reports hematoma with narrowing of the trachea at the thoracic inlet.  I relayed this to Arnetha Massy who is contacted Dr Trenton Gammon now   [MT]  2106 IMPRESSION: 1. Ovoid collection anterior to RIGHT sternocleidomastoid muscle in the subcutaneous tissues of the RIGHT anterior neck measures 4.5 x 2.4 cm. Stranding extends into the superior mediastinum obscuring the upper portion of the esophagus nearly completely likely displacing it anteriorly. This is better demonstrated on the neck CT. 2. Ovoid area of low density along the inferior margin of the spinal fusion, see dedicated neck CT for further detail. Stranding extends into the superior mediastinum from the low neck. Findings may all be postoperative though infection is considered 3. Basilar  atelectasis. 4. Incidental imaging of upper abdominal contents shows lobular hepatic margins. This raises the  question of liver disease.   [MT]  2108 Pt with no change in symptoms, still breathing and speaking comfortably, 99% on room air   [MT]  2131 Dr Trenton Gammon is present at bedside, he informed me that he plans to administer IV steroids and admit to hospital (his admission) for monitoring overnight.  He does not feel this warrants intubation or urgent airway intervention and states this can be typical after a procedure like this one, and he does not anticipate this is rapidly expanding hematoma.  He agrees with some IV clindamycin here.  With this discussion, she will be admitted.   [MT]    Clinical Course User Index [MT] Cheral Cappucci, Carola Rhine, MD    Final Clinical Impression(s) / ED Diagnoses Final diagnoses:  Hematoma    Rx / DC Orders ED Discharge Orders         Ordered    oxyCODONE-acetaminophen (PERCOCET/ROXICET) 5-325 MG tablet  Every 6 hours PRN,   Status:  Discontinued     07/21/19 2042           Wyvonnia Dusky, MD 07/21/19 2302

## 2019-07-21 NOTE — Discharge Instructions (Addendum)

## 2019-07-22 ENCOUNTER — Encounter: Payer: Self-pay | Admitting: *Deleted

## 2019-07-22 LAB — HIV ANTIBODY (ROUTINE TESTING W REFLEX): HIV Screen 4th Generation wRfx: NONREACTIVE

## 2019-07-22 NOTE — Progress Notes (Signed)
Overall stable overnight.  Still with some throat swelling but no significant dyspnea.  Able to swallow meds and liquids okay.  Continues to show swelling in her superficial compartment in her neck.  Deep structures appear to be soft and reasonably unaffected.  Her airway is midline.  She has no stridor at rest.  Neurologically she is awake and alert.  Oriented and appropriate.  Motor and sensory function are intact.  Patient with a significant superficial wound hematoma with some mild airway compromise.  We will continue to observe for now.  If situation worsens at all then I would consider reexploration of her neck and evacuation of hemorrhage.

## 2019-07-23 MED ORDER — AZITHROMYCIN 250 MG PO TABS
ORAL_TABLET | ORAL | 0 refills | Status: AC
Start: 2019-07-23 — End: 2019-07-28

## 2019-07-23 MED ORDER — METHYLPREDNISOLONE 4 MG PO TBPK
ORAL_TABLET | ORAL | 0 refills | Status: DC
Start: 2019-07-23 — End: 2022-08-24

## 2019-07-23 NOTE — TOC Transition Note (Signed)
Transition of Care [facility]) - CM/SW Discharge Note   Patient Details  Name: [name] MRN: [id number] Date of Birth: 11-Oct-1950  Transition of Care [facility]) CM/SW Contact:  Pollie Friar, RN Phone Number: 07/23/2019, 11:15 AM   Clinical Narrative:    Pt discharging home with self care and her spouse. Pt has hospital f/u and transportation home.  TOC signing off.    Final next level of care: Home/Self Care Barriers to Discharge: No Barriers Identified   Patient Goals and CMS Choice        Discharge Placement                       Discharge Plan and Services                                     Social Determinants of Health (SDOH) Interventions     Readmission Risk Interventions No flowsheet data found.

## 2019-07-23 NOTE — Discharge Summary (Signed)
Physician Discharge Summary  Patient ID: [name] MRN: [id number] DOB/AGE: 04/09/1950 69 y.o.  Admit date: 07/21/2019 Discharge date: 07/23/2019  Admission Diagnoses:  Discharge Diagnoses:  Active Problems:   Airway compromise   Discharged Condition: good  Hospital Course: Patient admitted to the hospital for evaluation of postoperative neck swelling and some difficulty breathing.  Patient was evaluated and found to have significant hemorrhage into the superficial compartment of her neck with some moderate compression upon her trachea.  The patient was treated with IV steroids and observed.  Her dyspnea has resolved.  She is swallowing well.  The hemorrhage appears to be confined to the superficial compartment and is not significantly compressing her trachea.  The patient feels good.  She feels ready for discharge home.  She has no new questions or concerns.  Consults:   Significant Diagnostic Studies:   Treatments:   Discharge Exam: Blood pressure 122/68, pulse 81, temperature 97.6 F (36.4 C), temperature source Oral, resp. rate 20, height 5\' 1"  (1.549 m), weight 82.3 kg, SpO2 100 %. Awake and alert.  Oriented and appropriate.  Motor and sensory function intact.  Wound clean and dry.  There is a superficial hematoma beneath the skin but airway is midline.  The airway appears uncompromised.  Patient swallowing well and breathing easily.  Chest and abdomen benign.  Disposition: Discharge disposition: 01-Home or Self Care        Allergies as of 07/23/2019   No Known Allergies     Medication List    TAKE these medications   albuterol 108 (90 Base) MCG/ACT inhaler Commonly known as: VENTOLIN HFA Inhale 2 puffs into the lungs every 6 (six) hours as needed for wheezing or shortness of breath.   ALPRAZolam 0.5 MG tablet Commonly known as: XANAX Take 0.5 mg by mouth at bedtime.   amLODipine-benazepril 5-40 MG capsule Commonly known as: LOTREL Take 1 capsule by mouth  at bedtime.   Artificial Tears 1.4 % ophthalmic solution Generic drug: polyvinyl alcohol Place 1 drop into both eyes 2 (two) times daily.   azithromycin 250 MG tablet Commonly known as: Zithromax Z-Pak Take 2 tablets (500 mg) on  Day 1,  followed by 1 tablet (250 mg) once daily on Days 2 through 5.   busPIRone 15 MG tablet Commonly known as: BUSPAR Take 15 mg by mouth 2 (two) times daily as needed (anxiety).   CITRACAL + D PO Take 400 mg by mouth daily with supper.   denosumab 60 MG/ML Soln injection Commonly known as: PROLIA Inject 60 mg into the skin every 6 (six) months.   EPINEPHrine 0.3 mg/0.3 mL Soaj injection Commonly known as: EPI-PEN Inject 0.3 mg into the muscle once as needed for anaphylaxis.   famotidine 40 MG tablet Commonly known as: PEPCID Take 40 mg by mouth at bedtime.   fexofenadine 180 MG tablet Commonly known as: ALLEGRA TAKE 1 TABLET DAILY What changed: when to take this   fluticasone 50 MCG/ACT nasal spray Commonly known as: FLONASE Place 1 spray into both nostrils as needed for allergies or rhinitis.   gabapentin 300 MG capsule Commonly known as: NEURONTIN Take 300 mg by mouth 2 (two) times daily as needed (nerve pain).   methocarbamol 500 MG tablet Commonly known as: ROBAXIN Take 1-2 tablets (500-1,000 mg total) by mouth every 6 (six) hours as needed for muscle spasms. What changed: Another medication with the same name was changed. Make sure you understand how and when to take each.   methocarbamol 500  MG tablet Commonly known as: Robaxin Take 1 tablet (500 mg total) by mouth 4 (four) times daily. What changed:   when to take this  reasons to take this   methylPREDNISolone 4 MG Tbpk tablet Commonly known as: MEDROL DOSEPAK follow package directions   montelukast 10 MG tablet Commonly known as: SINGULAIR Take 10 mg by mouth daily with supper.   OLANZapine 5 MG tablet Commonly known as: ZYPREXA Take 5 mg by mouth at bedtime.    oxyCODONE-acetaminophen 5-325 MG tablet Commonly known as: Percocet Take 1 tablet by mouth every 4 (four) hours as needed for severe pain.   PATADAY OP Place 1 drop into both eyes daily.   pravastatin 40 MG tablet Commonly known as: PRAVACHOL Take 40 mg by mouth at bedtime.      Follow-up Information    Go to  [facility].   Specialty: Emergency Medicine Why: If symptoms worsen Contact information: [address] [id number] [place] [phone]          Signed: Charlie Pitter 07/23/2019, 9:49 AM

## 2019-07-25 DIAGNOSIS — Z6832 Body mass index (BMI) 32.0-32.9, adult: Secondary | ICD-10-CM | POA: Diagnosis not present

## 2019-07-25 DIAGNOSIS — Z79899 Other long term (current) drug therapy: Secondary | ICD-10-CM | POA: Diagnosis not present

## 2019-07-25 DIAGNOSIS — S1093XS Contusion of unspecified part of neck, sequela: Secondary | ICD-10-CM | POA: Diagnosis not present

## 2019-07-25 DIAGNOSIS — Z7689 Persons encountering health services in other specified circumstances: Secondary | ICD-10-CM | POA: Diagnosis not present

## 2019-07-25 DIAGNOSIS — L03221 Cellulitis of neck: Secondary | ICD-10-CM | POA: Diagnosis not present

## 2019-07-26 ENCOUNTER — Inpatient Hospital Stay: Admit: 2019-07-26 | Payer: Medicare Other | Admitting: Neurosurgery

## 2019-07-26 SURGERY — ANTERIOR CERVICAL DECOMPRESSION/DISCECTOMY FUSION 3 LEVELS
Anesthesia: General

## 2019-08-07 DIAGNOSIS — R070 Pain in throat: Secondary | ICD-10-CM | POA: Diagnosis not present

## 2019-08-07 DIAGNOSIS — J069 Acute upper respiratory infection, unspecified: Secondary | ICD-10-CM | POA: Diagnosis not present

## 2019-08-07 DIAGNOSIS — J209 Acute bronchitis, unspecified: Secondary | ICD-10-CM | POA: Diagnosis not present

## 2019-08-07 DIAGNOSIS — R05 Cough: Secondary | ICD-10-CM | POA: Diagnosis not present

## 2019-08-20 DIAGNOSIS — M4802 Spinal stenosis, cervical region: Secondary | ICD-10-CM | POA: Diagnosis not present

## 2019-09-24 DIAGNOSIS — Z1212 Encounter for screening for malignant neoplasm of rectum: Secondary | ICD-10-CM | POA: Diagnosis not present

## 2019-09-24 DIAGNOSIS — Z1211 Encounter for screening for malignant neoplasm of colon: Secondary | ICD-10-CM | POA: Diagnosis not present

## 2019-10-09 DIAGNOSIS — F3181 Bipolar II disorder: Secondary | ICD-10-CM | POA: Diagnosis not present

## 2019-10-17 DIAGNOSIS — J019 Acute sinusitis, unspecified: Secondary | ICD-10-CM | POA: Diagnosis not present

## 2019-10-17 DIAGNOSIS — Z20828 Contact with and (suspected) exposure to other viral communicable diseases: Secondary | ICD-10-CM | POA: Diagnosis not present

## 2019-10-20 DIAGNOSIS — H05012 Cellulitis of left orbit: Secondary | ICD-10-CM | POA: Diagnosis not present

## 2019-11-04 DIAGNOSIS — Z1231 Encounter for screening mammogram for malignant neoplasm of breast: Secondary | ICD-10-CM | POA: Diagnosis not present

## 2019-11-05 DIAGNOSIS — H26493 Other secondary cataract, bilateral: Secondary | ICD-10-CM | POA: Diagnosis not present

## 2019-11-05 DIAGNOSIS — H43393 Other vitreous opacities, bilateral: Secondary | ICD-10-CM | POA: Diagnosis not present

## 2019-12-02 DIAGNOSIS — H1032 Unspecified acute conjunctivitis, left eye: Secondary | ICD-10-CM | POA: Diagnosis not present

## 2019-12-02 DIAGNOSIS — J309 Allergic rhinitis, unspecified: Secondary | ICD-10-CM | POA: Diagnosis not present

## 2019-12-02 DIAGNOSIS — H1031 Unspecified acute conjunctivitis, right eye: Secondary | ICD-10-CM | POA: Diagnosis not present

## 2019-12-16 DIAGNOSIS — N1831 Chronic kidney disease, stage 3a: Secondary | ICD-10-CM | POA: Diagnosis not present

## 2019-12-16 DIAGNOSIS — E782 Mixed hyperlipidemia: Secondary | ICD-10-CM | POA: Diagnosis not present

## 2019-12-16 DIAGNOSIS — E119 Type 2 diabetes mellitus without complications: Secondary | ICD-10-CM | POA: Diagnosis not present

## 2019-12-24 DIAGNOSIS — R059 Cough, unspecified: Secondary | ICD-10-CM | POA: Diagnosis not present

## 2019-12-24 DIAGNOSIS — Z5321 Procedure and treatment not carried out due to patient leaving prior to being seen by health care provider: Secondary | ICD-10-CM | POA: Diagnosis not present

## 2019-12-24 DIAGNOSIS — Z20822 Contact with and (suspected) exposure to covid-19: Secondary | ICD-10-CM | POA: Diagnosis not present

## 2019-12-25 DIAGNOSIS — U071 COVID-19: Secondary | ICD-10-CM | POA: Diagnosis not present

## 2020-01-14 DIAGNOSIS — I10 Essential (primary) hypertension: Secondary | ICD-10-CM | POA: Diagnosis not present

## 2020-01-14 DIAGNOSIS — J309 Allergic rhinitis, unspecified: Secondary | ICD-10-CM | POA: Diagnosis not present

## 2020-01-14 DIAGNOSIS — E782 Mixed hyperlipidemia: Secondary | ICD-10-CM | POA: Diagnosis not present

## 2020-01-14 DIAGNOSIS — K219 Gastro-esophageal reflux disease without esophagitis: Secondary | ICD-10-CM | POA: Diagnosis not present

## 2020-01-14 DIAGNOSIS — J45909 Unspecified asthma, uncomplicated: Secondary | ICD-10-CM | POA: Diagnosis not present

## 2020-01-14 DIAGNOSIS — Z6832 Body mass index (BMI) 32.0-32.9, adult: Secondary | ICD-10-CM | POA: Diagnosis not present

## 2020-01-14 DIAGNOSIS — N1831 Chronic kidney disease, stage 3a: Secondary | ICD-10-CM | POA: Diagnosis not present

## 2020-01-14 DIAGNOSIS — E785 Hyperlipidemia, unspecified: Secondary | ICD-10-CM | POA: Diagnosis not present

## 2020-01-14 DIAGNOSIS — E1169 Type 2 diabetes mellitus with other specified complication: Secondary | ICD-10-CM | POA: Diagnosis not present

## 2020-01-14 DIAGNOSIS — L4050 Arthropathic psoriasis, unspecified: Secondary | ICD-10-CM | POA: Diagnosis not present

## 2020-01-14 DIAGNOSIS — M810 Age-related osteoporosis without current pathological fracture: Secondary | ICD-10-CM | POA: Diagnosis not present

## 2020-02-04 DIAGNOSIS — M7672 Peroneal tendinitis, left leg: Secondary | ICD-10-CM | POA: Diagnosis not present

## 2020-02-04 DIAGNOSIS — E119 Type 2 diabetes mellitus without complications: Secondary | ICD-10-CM | POA: Diagnosis not present

## 2020-02-04 DIAGNOSIS — M792 Neuralgia and neuritis, unspecified: Secondary | ICD-10-CM | POA: Diagnosis not present

## 2020-03-12 DIAGNOSIS — M4802 Spinal stenosis, cervical region: Secondary | ICD-10-CM | POA: Diagnosis not present

## 2020-03-12 DIAGNOSIS — M79641 Pain in right hand: Secondary | ICD-10-CM | POA: Diagnosis not present

## 2020-03-12 DIAGNOSIS — L4050 Arthropathic psoriasis, unspecified: Secondary | ICD-10-CM | POA: Diagnosis not present

## 2020-03-19 DIAGNOSIS — M79641 Pain in right hand: Secondary | ICD-10-CM | POA: Diagnosis not present

## 2020-03-19 DIAGNOSIS — R202 Paresthesia of skin: Secondary | ICD-10-CM | POA: Diagnosis not present

## 2020-03-31 DIAGNOSIS — Z1211 Encounter for screening for malignant neoplasm of colon: Secondary | ICD-10-CM | POA: Diagnosis not present

## 2020-03-31 DIAGNOSIS — K580 Irritable bowel syndrome with diarrhea: Secondary | ICD-10-CM | POA: Diagnosis not present

## 2020-04-14 DIAGNOSIS — H1045 Other chronic allergic conjunctivitis: Secondary | ICD-10-CM | POA: Diagnosis not present

## 2020-04-15 DIAGNOSIS — F3181 Bipolar II disorder: Secondary | ICD-10-CM | POA: Diagnosis not present

## 2020-04-21 DIAGNOSIS — Z6832 Body mass index (BMI) 32.0-32.9, adult: Secondary | ICD-10-CM | POA: Diagnosis not present

## 2020-04-21 DIAGNOSIS — H1013 Acute atopic conjunctivitis, bilateral: Secondary | ICD-10-CM | POA: Diagnosis not present

## 2020-04-21 DIAGNOSIS — Z23 Encounter for immunization: Secondary | ICD-10-CM | POA: Diagnosis not present

## 2020-05-07 DIAGNOSIS — E785 Hyperlipidemia, unspecified: Secondary | ICD-10-CM | POA: Diagnosis not present

## 2020-05-07 DIAGNOSIS — Z1331 Encounter for screening for depression: Secondary | ICD-10-CM | POA: Diagnosis not present

## 2020-05-07 DIAGNOSIS — Z9181 History of falling: Secondary | ICD-10-CM | POA: Diagnosis not present

## 2020-05-07 DIAGNOSIS — E669 Obesity, unspecified: Secondary | ICD-10-CM | POA: Diagnosis not present

## 2020-05-07 DIAGNOSIS — Z0000 Encounter for general adult medical examination without abnormal findings: Secondary | ICD-10-CM | POA: Diagnosis not present

## 2020-05-11 DIAGNOSIS — H1045 Other chronic allergic conjunctivitis: Secondary | ICD-10-CM | POA: Diagnosis not present

## 2020-05-13 DIAGNOSIS — M7989 Other specified soft tissue disorders: Secondary | ICD-10-CM | POA: Diagnosis not present

## 2020-05-13 DIAGNOSIS — L409 Psoriasis, unspecified: Secondary | ICD-10-CM | POA: Diagnosis not present

## 2020-05-13 DIAGNOSIS — Z6832 Body mass index (BMI) 32.0-32.9, adult: Secondary | ICD-10-CM | POA: Diagnosis not present

## 2020-05-13 DIAGNOSIS — M2550 Pain in unspecified joint: Secondary | ICD-10-CM | POA: Diagnosis not present

## 2020-05-13 DIAGNOSIS — E669 Obesity, unspecified: Secondary | ICD-10-CM | POA: Diagnosis not present

## 2020-05-26 DIAGNOSIS — H10533 Contact blepharoconjunctivitis, bilateral: Secondary | ICD-10-CM | POA: Diagnosis not present

## 2020-06-11 DIAGNOSIS — N3091 Cystitis, unspecified with hematuria: Secondary | ICD-10-CM | POA: Diagnosis not present

## 2020-06-11 DIAGNOSIS — R300 Dysuria: Secondary | ICD-10-CM | POA: Diagnosis not present

## 2020-07-01 DIAGNOSIS — M4802 Spinal stenosis, cervical region: Secondary | ICD-10-CM | POA: Diagnosis not present

## 2020-07-01 DIAGNOSIS — M5090 Cervical disc disorder, unspecified, unspecified cervical region: Secondary | ICD-10-CM | POA: Diagnosis not present

## 2020-07-17 DIAGNOSIS — N1831 Chronic kidney disease, stage 3a: Secondary | ICD-10-CM | POA: Diagnosis not present

## 2020-07-17 DIAGNOSIS — E1169 Type 2 diabetes mellitus with other specified complication: Secondary | ICD-10-CM | POA: Diagnosis not present

## 2020-07-17 DIAGNOSIS — E782 Mixed hyperlipidemia: Secondary | ICD-10-CM | POA: Diagnosis not present

## 2020-07-28 DIAGNOSIS — I10 Essential (primary) hypertension: Secondary | ICD-10-CM | POA: Diagnosis not present

## 2020-07-28 DIAGNOSIS — N1831 Chronic kidney disease, stage 3a: Secondary | ICD-10-CM | POA: Diagnosis not present

## 2020-07-28 DIAGNOSIS — Z139 Encounter for screening, unspecified: Secondary | ICD-10-CM | POA: Diagnosis not present

## 2020-07-28 DIAGNOSIS — J309 Allergic rhinitis, unspecified: Secondary | ICD-10-CM | POA: Diagnosis not present

## 2020-07-28 DIAGNOSIS — L4050 Arthropathic psoriasis, unspecified: Secondary | ICD-10-CM | POA: Diagnosis not present

## 2020-07-28 DIAGNOSIS — M810 Age-related osteoporosis without current pathological fracture: Secondary | ICD-10-CM | POA: Diagnosis not present

## 2020-07-28 DIAGNOSIS — E782 Mixed hyperlipidemia: Secondary | ICD-10-CM | POA: Diagnosis not present

## 2020-07-28 DIAGNOSIS — E1169 Type 2 diabetes mellitus with other specified complication: Secondary | ICD-10-CM | POA: Diagnosis not present

## 2020-07-28 DIAGNOSIS — Z6832 Body mass index (BMI) 32.0-32.9, adult: Secondary | ICD-10-CM | POA: Diagnosis not present

## 2020-07-28 DIAGNOSIS — E785 Hyperlipidemia, unspecified: Secondary | ICD-10-CM | POA: Diagnosis not present

## 2020-07-28 DIAGNOSIS — K219 Gastro-esophageal reflux disease without esophagitis: Secondary | ICD-10-CM | POA: Diagnosis not present

## 2020-09-09 DIAGNOSIS — H02886 Meibomian gland dysfunction of left eye, unspecified eyelid: Secondary | ICD-10-CM | POA: Diagnosis not present

## 2020-09-09 DIAGNOSIS — H02883 Meibomian gland dysfunction of right eye, unspecified eyelid: Secondary | ICD-10-CM | POA: Diagnosis not present

## 2020-11-13 DIAGNOSIS — M150 Primary generalized (osteo)arthritis: Secondary | ICD-10-CM | POA: Diagnosis not present

## 2020-11-13 DIAGNOSIS — E663 Overweight: Secondary | ICD-10-CM | POA: Diagnosis not present

## 2020-11-13 DIAGNOSIS — Z6831 Body mass index (BMI) 31.0-31.9, adult: Secondary | ICD-10-CM | POA: Diagnosis not present

## 2020-11-13 DIAGNOSIS — L409 Psoriasis, unspecified: Secondary | ICD-10-CM | POA: Diagnosis not present

## 2020-11-13 DIAGNOSIS — M2550 Pain in unspecified joint: Secondary | ICD-10-CM | POA: Diagnosis not present

## 2020-11-19 DIAGNOSIS — M7918 Myalgia, other site: Secondary | ICD-10-CM | POA: Diagnosis not present

## 2020-11-19 DIAGNOSIS — M47816 Spondylosis without myelopathy or radiculopathy, lumbar region: Secondary | ICD-10-CM | POA: Diagnosis not present

## 2020-11-24 DIAGNOSIS — F3181 Bipolar II disorder: Secondary | ICD-10-CM | POA: Diagnosis not present

## 2020-11-25 DIAGNOSIS — I10 Essential (primary) hypertension: Secondary | ICD-10-CM | POA: Diagnosis not present

## 2020-11-25 DIAGNOSIS — Z6832 Body mass index (BMI) 32.0-32.9, adult: Secondary | ICD-10-CM | POA: Diagnosis not present

## 2020-11-25 DIAGNOSIS — M4802 Spinal stenosis, cervical region: Secondary | ICD-10-CM | POA: Diagnosis not present

## 2020-12-02 DIAGNOSIS — M542 Cervicalgia: Secondary | ICD-10-CM | POA: Diagnosis not present

## 2020-12-08 DIAGNOSIS — M542 Cervicalgia: Secondary | ICD-10-CM | POA: Diagnosis not present

## 2020-12-09 DIAGNOSIS — M542 Cervicalgia: Secondary | ICD-10-CM | POA: Diagnosis not present

## 2020-12-15 DIAGNOSIS — M542 Cervicalgia: Secondary | ICD-10-CM | POA: Diagnosis not present

## 2020-12-17 DIAGNOSIS — M542 Cervicalgia: Secondary | ICD-10-CM | POA: Diagnosis not present

## 2020-12-22 DIAGNOSIS — M542 Cervicalgia: Secondary | ICD-10-CM | POA: Diagnosis not present

## 2020-12-24 DIAGNOSIS — M542 Cervicalgia: Secondary | ICD-10-CM | POA: Diagnosis not present

## 2020-12-25 DIAGNOSIS — E2839 Other primary ovarian failure: Secondary | ICD-10-CM | POA: Diagnosis not present

## 2020-12-25 DIAGNOSIS — M8589 Other specified disorders of bone density and structure, multiple sites: Secondary | ICD-10-CM | POA: Diagnosis not present

## 2020-12-25 DIAGNOSIS — Z1231 Encounter for screening mammogram for malignant neoplasm of breast: Secondary | ICD-10-CM | POA: Diagnosis not present

## 2020-12-28 DIAGNOSIS — H26493 Other secondary cataract, bilateral: Secondary | ICD-10-CM | POA: Diagnosis not present

## 2020-12-28 DIAGNOSIS — H10533 Contact blepharoconjunctivitis, bilateral: Secondary | ICD-10-CM | POA: Diagnosis not present

## 2020-12-31 DIAGNOSIS — M542 Cervicalgia: Secondary | ICD-10-CM | POA: Diagnosis not present

## 2021-01-02 ENCOUNTER — Emergency Department (HOSPITAL_COMMUNITY)
Admission: EM | Admit: 2021-01-02 | Discharge: 2021-01-02 | Disposition: A | Discharge status: Home / Self Care | Payer: Medicare Other | Attending: Emergency Medicine | Admitting: Emergency Medicine

## 2021-01-02 ENCOUNTER — Emergency Department (HOSPITAL_COMMUNITY): Payer: Medicare Other

## 2021-01-02 DIAGNOSIS — Z96653 Presence of artificial knee joint, bilateral: Secondary | ICD-10-CM | POA: Diagnosis not present

## 2021-01-02 DIAGNOSIS — I10 Essential (primary) hypertension: Secondary | ICD-10-CM | POA: Insufficient documentation

## 2021-01-02 DIAGNOSIS — J45909 Unspecified asthma, uncomplicated: Secondary | ICD-10-CM | POA: Diagnosis not present

## 2021-01-02 DIAGNOSIS — R319 Hematuria, unspecified: Secondary | ICD-10-CM | POA: Diagnosis not present

## 2021-01-02 DIAGNOSIS — Z85828 Personal history of other malignant neoplasm of skin: Secondary | ICD-10-CM | POA: Diagnosis not present

## 2021-01-02 DIAGNOSIS — K529 Noninfective gastroenteritis and colitis, unspecified: Secondary | ICD-10-CM | POA: Diagnosis not present

## 2021-01-02 DIAGNOSIS — R109 Unspecified abdominal pain: Secondary | ICD-10-CM | POA: Diagnosis not present

## 2021-01-02 DIAGNOSIS — R1032 Left lower quadrant pain: Secondary | ICD-10-CM | POA: Diagnosis present

## 2021-01-02 LAB — COMPREHENSIVE METABOLIC PANEL
ALT: 9 U/L (ref 0–44)
AST: 19 U/L (ref 15–41)
Albumin: 4.3 g/dL (ref 3.5–5.0)
Alkaline Phosphatase: 73 U/L (ref 38–126)
Anion gap: 9 (ref 5–15)
BUN: 21 mg/dL (ref 8–23)
CO2: 24 mmol/L (ref 22–32)
Calcium: 10.1 mg/dL (ref 8.9–10.3)
Chloride: 104 mmol/L (ref 98–111)
Creatinine, Ser: 1.04 mg/dL — ABNORMAL HIGH (ref 0.44–1.00)
GFR, Estimated: 57 mL/min — ABNORMAL LOW (ref >60)
Glucose, Bld: 104 mg/dL — ABNORMAL HIGH (ref 70–99)
Potassium: 4 mmol/L (ref 3.5–5.1)
Sodium: 137 mmol/L (ref 135–145)
Total Bilirubin: 0.8 mg/dL (ref 0.3–1.2)
Total Protein: 7.4 g/dL (ref 6.5–8.1)

## 2021-01-02 LAB — CBC
HCT: 40.7 % (ref 36.0–46.0)
Hemoglobin: 12.9 g/dL (ref 12.0–15.0)
MCH: 27.5 pg (ref 26.0–34.0)
MCHC: 31.7 g/dL (ref 30.0–36.0)
MCV: 86.8 fL (ref 80.0–100.0)
Platelets: 276 10*3/uL (ref 150–400)
RBC: 4.69 MIL/uL (ref 3.87–5.11)
RDW: 14.9 % (ref 11.5–15.5)
WBC: 13.7 10*3/uL — ABNORMAL HIGH (ref 4.0–10.5)
nRBC: 0 % (ref 0.0–0.2)

## 2021-01-02 LAB — TYPE AND SCREEN
ABO/RH(D): O POS
Antibody Screen: NEGATIVE

## 2021-01-02 MED ORDER — SODIUM CHLORIDE 0.9 % IV BOLUS
1000.0000 mL | Freq: Once | INTRAVENOUS | Status: AC
  Administered 2021-01-02: 1000 mL via INTRAVENOUS

## 2021-01-02 MED ORDER — ONDANSETRON HCL 4 MG/2ML IJ SOLN
4.0000 mg | Freq: Once | INTRAMUSCULAR | Status: AC
  Administered 2021-01-02: 4 mg via INTRAVENOUS
  Filled 2021-01-02: qty 2

## 2021-01-02 MED ORDER — HYDROCODONE-ACETAMINOPHEN 5-325 MG PO TABS: 1.0000 | ORAL_TABLET | Freq: Four times a day (QID) | ORAL | 0 refills | Status: DC | PRN

## 2021-01-02 MED ORDER — AMOXICILLIN-POT CLAVULANATE 875-125 MG PO TABS
1.0000 | ORAL_TABLET | Freq: Two times a day (BID) | ORAL | 0 refills | Status: DC
Start: 2021-01-02 — End: 2021-01-02

## 2021-01-02 MED ORDER — ONDANSETRON HCL 4 MG PO TABS
4.0000 mg | ORAL_TABLET | Freq: Three times a day (TID) | ORAL | 0 refills | Status: DC | PRN
Start: 2021-01-02 — End: 2022-08-24

## 2021-01-02 MED ORDER — MORPHINE SULFATE (PF) 4 MG/ML IV SOLN
4.0000 mg | Freq: Once | INTRAVENOUS | Status: AC
  Administered 2021-01-02: 4 mg via INTRAVENOUS
  Filled 2021-01-02: qty 1

## 2021-01-02 MED ORDER — AMOXICILLIN-POT CLAVULANATE 875-125 MG PO TABS
1.0000 | ORAL_TABLET | Freq: Two times a day (BID) | ORAL | 0 refills | Status: DC
Start: 2021-01-02 — End: 2022-08-24

## 2021-01-02 MED ORDER — ONDANSETRON HCL 4 MG PO TABS
4.0000 mg | ORAL_TABLET | Freq: Three times a day (TID) | ORAL | 0 refills | Status: DC | PRN
Start: 2021-01-02 — End: 2021-01-02

## 2021-01-02 MED ORDER — IOHEXOL 300 MG/ML  SOLN
80.0000 mL | Freq: Once | INTRAMUSCULAR | Status: AC | PRN
  Administered 2021-01-02: 80 mL via INTRAVENOUS

## 2021-01-02 MED ORDER — AMOXICILLIN-POT CLAVULANATE 875-125 MG PO TABS
1.0000 | ORAL_TABLET | Freq: Once | ORAL | Status: AC
  Administered 2021-01-02: 1 via ORAL
  Filled 2021-01-02: qty 1

## 2021-01-02 NOTE — ED Triage Notes (Signed)
Pt here POV with c/o abdominal pain   . History of diverticulitis. Began Thursday.  Endorses rectal bleeding.

## 2021-01-02 NOTE — Discharge Instructions (Addendum)
You have been diagnosed with colitis.  Please take antibiotic as prescribed.  Take pain medication as needed but be aware that it can cause drowsiness.  1 for nauseous, you may take Zofran for nausea.  Follow-up with your doctor for further care.  Return if you have any concern.

## 2021-01-02 NOTE — ED Provider Notes (Signed)
[facility] EMERGENCY DEPARTMENT Provider Note   CSN: [id number] Arrival date & time: 01/02/21  1130     History Chief Complaint  Patient presents with   Abdominal Pain    [name] is a 70 y.o. female.  The history is provided by the patient and medical records. No language interpreter was used.  Abdominal Pain  70 year old significant history of diverticulosis, GERD, squamous cell carcinoma, anemia who presents for evaluation abdominal pain.  Patient report for the past 4 to 5 days she has had intermittent left lower quadrant abdominal pain.  She described pain as a crampy colicky sensation having waxing waning but has increased in intensity for the past 2 days.  Furthermore she also noticed bright red blood per rectum when she had bowel movement for the past few days.  She endorsed increased bowel movement frequency.  She feels wiped out.  She did endorse 1 bouts of nausea and vomiting a couple days ago but none since.  She denies having fever or chills no chest pain shortness of breath productive cough no dysuria.  Last colonoscopy was more than 10 years ago.  She is not on any blood thinner medication.    Past Medical History:  Diagnosis Date   Anemia    Anxiety    Arthritis    Asthma    Cancer ([place]) 2002   Squamous Carcinoma   Depression    GERD (gastroesophageal reflux disease)    Heart murmur    Mitral Valve prolapse   History of kidney stones    Hypertension    Pneumonia    PONV (postoperative nausea and vomiting) 2003   Too much anesthesia; hard to wake up/coma; N/V   Pre-diabetes     Patient Active Problem List   Diagnosis Date Noted   Airway compromise 07/21/2019   Cervical spondylosis with myelopathy and radiculopathy 07/19/2019   UTI (urinary tract infection) 12/28/2017   Microscopic hematuria 12/28/2017   Normocytic anemia 12/28/2017   Osteoporosis 12/28/2017   Sacral insufficiency fracture with delayed healing 12/27/2017   S/P  revision of total knee 05/30/2016   Essential hypertension 11/21/2014   Asthma 11/12/2014   Allergic rhinoconjunctivitis 11/12/2014   GERD (gastroesophageal reflux disease) 11/12/2014    Past Surgical History:  Procedure Laterality Date   ABDOMINAL HYSTERECTOMY     ANTERIOR CERVICAL DECOMP/DISCECTOMY FUSION N/A 07/19/2019   Procedure: Anterior Cerivcal Discectomy Fusion - Cervical Four-Cervical Five - Cervical Five- Cervical Six - Cervical Six- Cervical Seven;  Surgeon: Earnie Larsson, MD;  Location: [place];  Service: Neurosurgery;  Laterality: N/A;  Anterior Cerivcal Discectomy Fusion - Cervical Four-Cervical Five - Cervical Five- Cervical Six - Cervical Six- Cervical Seven   EYE SURGERY  2020   Cataract lens replacement   FRACTURE SURGERY Left 2003   Humerous fracture   JOINT REPLACEMENT     right and left knee replacement   kidney stone removed     TONSILLECTOMY     TOTAL KNEE REVISION Right 05/30/2016   Procedure: TOTAL KNEE REVISION;  Surgeon: Vickey Huger, MD;  Location: [facility];  Service: Orthopedics;  Laterality: Right;   WRIST ARTHROPLASTY Left      OB History   No obstetric history on file.     Family History  Problem Relation Age of Onset   Heart attack Brother     Social History   Tobacco Use   Smoking status: Never   Smokeless tobacco: Never  Vaping Use   Vaping Use: Never  used  Substance Use Topics   Alcohol use: No   Drug use: No    Home Medications Prior to Admission medications   Medication Sig Start Date End Date Taking? Authorizing Provider  albuterol (PROVENTIL HFA;VENTOLIN HFA) 108 (90 BASE) MCG/ACT inhaler Inhale 2 puffs into the lungs every 6 (six) hours as needed for wheezing or shortness of breath.    [provider]  ALPRAZolam Duanne Moron) 0.5 MG tablet Take 0.5 mg by mouth at bedtime.    [provider]  amLODipine-benazepril (LOTREL) 5-40 MG capsule Take 1 capsule by mouth at bedtime.     [provider]  busPIRone  (BUSPAR) 15 MG tablet Take 15 mg by mouth 2 (two) times daily as needed (anxiety).    [provider]  Calcium Citrate-Vitamin D (CITRACAL + D PO) Take 400 mg by mouth daily with supper.    [provider]  denosumab (PROLIA) 60 MG/ML SOLN injection Inject 60 mg into the skin every 6 (six) months.    [provider]  EPINEPHrine 0.3 mg/0.3 mL IJ SOAJ injection Inject 0.3 mg into the muscle once as needed for anaphylaxis.     [provider]  famotidine (PEPCID) 40 MG tablet Take 40 mg by mouth at bedtime.  07/11/19   [provider]  fexofenadine (ALLEGRA) 180 MG tablet TAKE 1 TABLET DAILY Patient taking differently: Take 180 mg by mouth daily with supper.  09/03/15   Kozlow, Donnamarie Poag, MD  fluticasone (FLONASE) 50 MCG/ACT nasal spray Place 1 spray into both nostrils as needed for allergies or rhinitis. 01/26/15   Kozlow, Donnamarie Poag, MD  gabapentin (NEURONTIN) 300 MG capsule Take 300 mg by mouth 2 (two) times daily as needed (nerve pain).  07/13/19   [provider]  methocarbamol (ROBAXIN) 500 MG tablet Take 1-2 tablets (500-1,000 mg total) by mouth every 6 (six) hours as needed for muscle spasms. Patient not taking: Reported on 07/21/2019 05/31/16   Donia Ast, PA  methocarbamol (ROBAXIN) 500 MG tablet Take 1 tablet (500 mg total) by mouth 4 (four) times daily. Patient taking differently: Take 500 mg by mouth 4 (four) times daily as needed for muscle spasms.  07/20/19   Meyran, Ocie Cornfield, NP  methylPREDNISolone (MEDROL DOSEPAK) 4 MG TBPK tablet follow package directions 07/23/19   Earnie Larsson, MD  montelukast (SINGULAIR) 10 MG tablet Take 10 mg by mouth daily with supper.     [provider]  OLANZapine (ZYPREXA) 5 MG tablet Take 5 mg by mouth at bedtime.    [provider]  Olopatadine HCl (PATADAY OP) Place 1 drop into both eyes daily.    [provider]  polyvinyl alcohol (ARTIFICIAL TEARS) 1.4 % ophthalmic solution  Place 1 drop into both eyes 2 (two) times daily.    [provider]  pravastatin (PRAVACHOL) 40 MG tablet Take 40 mg by mouth at bedtime.    [provider]    Allergies    Patient has no known allergies.  Review of Systems   Review of Systems  Gastrointestinal:  Positive for abdominal pain.  All other systems reviewed and are negative.  Physical Exam Updated Vital Signs BP 135/88 (BP Location: Right Arm)   Pulse 92   Temp 99 F (37.2 C) (Oral)   Resp 16   SpO2 99%   Physical Exam Vitals and nursing note reviewed.  Constitutional:      General: She is not in acute distress.    Appearance: She  is well-developed.  HENT:     Head: Atraumatic.  Eyes:     Conjunctiva/sclera: Conjunctivae normal.  Cardiovascular:     Rate and Rhythm: Normal rate and regular rhythm.  Pulmonary:     Effort: Pulmonary effort is normal.  Abdominal:     General: Abdomen is flat. Bowel sounds are normal.     Palpations: Abdomen is soft.     Tenderness: There is abdominal tenderness in the left lower quadrant. There is no guarding or rebound.  Musculoskeletal:     Cervical back: Neck supple.  Skin:    Findings: No rash.  Neurological:     Mental Status: She is alert.  Psychiatric:        Mood and Affect: Mood normal.    ED Results / Procedures / Treatments   Labs (all labs ordered are listed, but only abnormal results are displayed) Labs Reviewed  COMPREHENSIVE METABOLIC PANEL - Abnormal; Notable for the following components:      Result Value   Glucose, Bld 104 (*)    Creatinine, Ser 1.04 (*)    GFR, Estimated 57 (*)    All other components within normal limits  CBC - Abnormal; Notable for the following components:   WBC 13.7 (*)    All other components within normal limits  POC OCCULT BLOOD, ED  TYPE AND SCREEN    EKG None  Radiology CT ABDOMEN PELVIS W CONTRAST  Result Date: 01/02/2021 CLINICAL DATA:  Abdominal pain. EXAM: CT ABDOMEN AND PELVIS WITH  CONTRAST TECHNIQUE: Multidetector CT imaging of the abdomen and pelvis was performed using the standard protocol following bolus administration of intravenous contrast. CONTRAST:  62mL OMNIPAQUE IOHEXOL 300 MG/ML  SOLN COMPARISON:  August 17, 2018 FINDINGS: Lower chest: No acute abnormality. Hepatobiliary: No focal liver abnormality is seen. No gallstones, gallbladder wall thickening, or biliary dilatation. Pancreas: Unremarkable. No pancreatic ductal dilatation or surrounding inflammatory changes. Spleen: Normal in size without focal abnormality. Adrenals/Urinary Tract: Adrenal glands are unremarkable. Kidneys are normal in size, without obstructing renal calculi or hydronephrosis. 2 mm and 4 mm nonobstructing renal stones are seen within the mid and lower right kidney. A 1.0 cm diameter exophytic simple cyst is seen along the anterolateral aspect of the mid right kidney. Bladder is unremarkable. Stomach/Bowel: There is a small hiatal hernia. The appendix is not clearly identified. No evidence of bowel dilatation. Moderate to marked severity thickening of the descending and proximal to mid sigmoid colon is seen. Numerous diverticula are noted within the sigmoid colon. Vascular/Lymphatic: Mild aortic atherosclerosis. No enlarged abdominal or pelvic lymph nodes. Reproductive: Status post hysterectomy. No adnexal masses. Other: No abdominal wall hernia or abnormality. No abdominopelvic ascites. Musculoskeletal: Degenerative changes are seen throughout the lumbar spine. IMPRESSION: 1. Moderate to marked severity colitis involving the descending and proximal to mid sigmoid colon. 2. Sigmoid diverticulosis. 3. Small hiatal hernia. 4. Small nonobstructing right renal calculi. Aortic Atherosclerosis (ICD10-I70.0). Electronically Signed   By: Virgina Norfolk M.D.   On: 01/02/2021 16:52    Procedures Procedures   Medications Ordered in ED Medications  amoxicillin-clavulanate (AUGMENTIN) 875-125 MG per tablet 1 tablet  (has no administration in time range)  sodium chloride 0.9 % bolus 1,000 mL (1,000 mLs Intravenous New Bag/Given 01/02/21 1548)  ondansetron (ZOFRAN) injection 4 mg (4 mg Intravenous Given 01/02/21 1543)  morphine 4 MG/ML injection 4 mg (4 mg Intravenous Given 01/02/21 1544)  iohexol (OMNIPAQUE) 300 MG/ML solution 80 mL (80 mLs Intravenous Contrast Given 01/02/21 1637)  ED Course  I have reviewed the triage vital signs and the nursing notes.  Pertinent labs & imaging results that were available during my care of the patient were reviewed by me and considered in my medical decision making (see chart for details).    MDM Rules/Calculators/A&P                           BP 126/69   Pulse 73   Temp 99 F (37.2 C) (Oral)   Resp 13   SpO2 97%   Final Clinical Impression(s) / ED Diagnoses Final diagnoses:  Colitis    Rx / DC Orders ED Discharge Orders          Ordered    amoxicillin-clavulanate (AUGMENTIN) 875-125 MG tablet  Every 12 hours        01/02/21 1707    ondansetron (ZOFRAN) 4 MG tablet  Every 8 hours PRN        01/02/21 1707    HYDROcodone-acetaminophen (NORCO/VICODIN) 5-325 MG tablet  Every 6 hours PRN        01/02/21 1707           3:27 PM Patient is here with left lower quadrant abdominal pain for the past 2 days.  Symptoms suggestive of diverticular disease.  She does have reproducible tenderness on exam without guarding or rebound tenderness.  She is warranted for a abdominal pelvis CT scan for further assessment.  We will give IV fluid, antinausea medication and pain management.  5:11 PM CT scan of the abdomen pelvis demonstrate evidence of colitis.  This is consistent with patient's presenting complaint.  She tolerates p.o., patient stable to be discharged home with antibiotic Augmentin.  She will follow-up with PCP for further care.  Return precaution given.  Care discussed with Dr. Zenia Resides.   Domenic Moras, PA-C 01/02/21 2336    Lacretia Leigh,  MD 01/05/21 [phone]

## 2021-01-02 NOTE — ED Provider Notes (Signed)
I provided a substantive portion of the care of this patient.  I personally performed the entirety of the medical decision making for this encounter.  70 year old female presents with abdominal pain.  Has evidence of colitis.  Will place on antibiotics and discharged home.   Lacretia Leigh, MD 01/02/21 [phone]

## 2021-01-08 DIAGNOSIS — K529 Noninfective gastroenteritis and colitis, unspecified: Secondary | ICD-10-CM | POA: Diagnosis not present

## 2021-01-08 DIAGNOSIS — K589 Irritable bowel syndrome without diarrhea: Secondary | ICD-10-CM | POA: Diagnosis not present

## 2021-01-08 DIAGNOSIS — Z6832 Body mass index (BMI) 32.0-32.9, adult: Secondary | ICD-10-CM | POA: Diagnosis not present

## 2021-01-18 DIAGNOSIS — M542 Cervicalgia: Secondary | ICD-10-CM | POA: Diagnosis not present

## 2021-01-19 DIAGNOSIS — M542 Cervicalgia: Secondary | ICD-10-CM | POA: Diagnosis not present

## 2021-01-22 DIAGNOSIS — E1169 Type 2 diabetes mellitus with other specified complication: Secondary | ICD-10-CM | POA: Diagnosis not present

## 2021-01-22 DIAGNOSIS — E782 Mixed hyperlipidemia: Secondary | ICD-10-CM | POA: Diagnosis not present

## 2021-01-26 DIAGNOSIS — E1169 Type 2 diabetes mellitus with other specified complication: Secondary | ICD-10-CM | POA: Diagnosis not present

## 2021-01-26 DIAGNOSIS — Z6832 Body mass index (BMI) 32.0-32.9, adult: Secondary | ICD-10-CM | POA: Diagnosis not present

## 2021-01-26 DIAGNOSIS — E782 Mixed hyperlipidemia: Secondary | ICD-10-CM | POA: Diagnosis not present

## 2021-01-26 DIAGNOSIS — F325 Major depressive disorder, single episode, in full remission: Secondary | ICD-10-CM | POA: Diagnosis not present

## 2021-01-26 DIAGNOSIS — M810 Age-related osteoporosis without current pathological fracture: Secondary | ICD-10-CM | POA: Diagnosis not present

## 2021-01-26 DIAGNOSIS — I10 Essential (primary) hypertension: Secondary | ICD-10-CM | POA: Diagnosis not present

## 2021-01-26 DIAGNOSIS — N1831 Chronic kidney disease, stage 3a: Secondary | ICD-10-CM | POA: Diagnosis not present

## 2021-01-26 DIAGNOSIS — E785 Hyperlipidemia, unspecified: Secondary | ICD-10-CM | POA: Diagnosis not present

## 2021-02-01 DIAGNOSIS — M542 Cervicalgia: Secondary | ICD-10-CM | POA: Diagnosis not present

## 2021-02-03 DIAGNOSIS — K219 Gastro-esophageal reflux disease without esophagitis: Secondary | ICD-10-CM | POA: Diagnosis not present

## 2021-02-03 DIAGNOSIS — R109 Unspecified abdominal pain: Secondary | ICD-10-CM | POA: Diagnosis not present

## 2021-02-03 DIAGNOSIS — R198 Other specified symptoms and signs involving the digestive system and abdomen: Secondary | ICD-10-CM | POA: Diagnosis not present

## 2021-02-04 DIAGNOSIS — M542 Cervicalgia: Secondary | ICD-10-CM | POA: Diagnosis not present

## 2021-02-08 DIAGNOSIS — M542 Cervicalgia: Secondary | ICD-10-CM | POA: Diagnosis not present

## 2021-02-11 DIAGNOSIS — M542 Cervicalgia: Secondary | ICD-10-CM | POA: Diagnosis not present

## 2021-02-13 DIAGNOSIS — W5503XA Scratched by cat, initial encounter: Secondary | ICD-10-CM | POA: Diagnosis not present

## 2021-02-13 DIAGNOSIS — L0390 Cellulitis, unspecified: Secondary | ICD-10-CM | POA: Diagnosis not present

## 2021-02-15 DIAGNOSIS — M542 Cervicalgia: Secondary | ICD-10-CM | POA: Diagnosis not present

## 2021-02-18 DIAGNOSIS — M542 Cervicalgia: Secondary | ICD-10-CM | POA: Diagnosis not present

## 2021-02-24 DIAGNOSIS — R051 Acute cough: Secondary | ICD-10-CM | POA: Diagnosis not present

## 2021-02-24 DIAGNOSIS — R509 Fever, unspecified: Secondary | ICD-10-CM | POA: Diagnosis not present

## 2021-02-24 DIAGNOSIS — R5382 Chronic fatigue, unspecified: Secondary | ICD-10-CM | POA: Diagnosis not present

## 2021-02-24 DIAGNOSIS — M791 Myalgia, unspecified site: Secondary | ICD-10-CM | POA: Diagnosis not present

## 2021-02-24 DIAGNOSIS — Z20828 Contact with and (suspected) exposure to other viral communicable diseases: Secondary | ICD-10-CM | POA: Diagnosis not present

## 2021-03-18 DIAGNOSIS — M542 Cervicalgia: Secondary | ICD-10-CM | POA: Diagnosis not present

## 2021-03-23 DIAGNOSIS — M542 Cervicalgia: Secondary | ICD-10-CM | POA: Diagnosis not present

## 2021-03-30 DIAGNOSIS — M542 Cervicalgia: Secondary | ICD-10-CM | POA: Diagnosis not present

## 2021-04-05 DIAGNOSIS — R109 Unspecified abdominal pain: Secondary | ICD-10-CM | POA: Diagnosis not present

## 2021-04-05 DIAGNOSIS — R198 Other specified symptoms and signs involving the digestive system and abdomen: Secondary | ICD-10-CM | POA: Diagnosis not present

## 2021-04-05 DIAGNOSIS — K219 Gastro-esophageal reflux disease without esophagitis: Secondary | ICD-10-CM | POA: Diagnosis not present

## 2021-04-06 DIAGNOSIS — M542 Cervicalgia: Secondary | ICD-10-CM | POA: Diagnosis not present

## 2021-04-20 DIAGNOSIS — M542 Cervicalgia: Secondary | ICD-10-CM | POA: Diagnosis not present

## 2021-04-20 DIAGNOSIS — M2560 Stiffness of unspecified joint, not elsewhere classified: Secondary | ICD-10-CM | POA: Diagnosis not present

## 2021-04-27 DIAGNOSIS — K208 Other esophagitis without bleeding: Secondary | ICD-10-CM | POA: Diagnosis not present

## 2021-04-27 DIAGNOSIS — R198 Other specified symptoms and signs involving the digestive system and abdomen: Secondary | ICD-10-CM | POA: Diagnosis not present

## 2021-04-27 DIAGNOSIS — K449 Diaphragmatic hernia without obstruction or gangrene: Secondary | ICD-10-CM | POA: Diagnosis not present

## 2021-04-27 DIAGNOSIS — Z1211 Encounter for screening for malignant neoplasm of colon: Secondary | ICD-10-CM | POA: Diagnosis not present

## 2021-04-27 DIAGNOSIS — K219 Gastro-esophageal reflux disease without esophagitis: Secondary | ICD-10-CM | POA: Diagnosis not present

## 2021-04-27 DIAGNOSIS — K2210 Ulcer of esophagus without bleeding: Secondary | ICD-10-CM | POA: Diagnosis not present

## 2021-04-27 DIAGNOSIS — K5730 Diverticulosis of large intestine without perforation or abscess without bleeding: Secondary | ICD-10-CM | POA: Diagnosis not present

## 2021-04-29 DIAGNOSIS — M2560 Stiffness of unspecified joint, not elsewhere classified: Secondary | ICD-10-CM | POA: Diagnosis not present

## 2021-04-29 DIAGNOSIS — M542 Cervicalgia: Secondary | ICD-10-CM | POA: Diagnosis not present

## 2021-05-11 DIAGNOSIS — M2560 Stiffness of unspecified joint, not elsewhere classified: Secondary | ICD-10-CM | POA: Diagnosis not present

## 2021-05-11 DIAGNOSIS — M542 Cervicalgia: Secondary | ICD-10-CM | POA: Diagnosis not present

## 2021-05-18 DIAGNOSIS — M2560 Stiffness of unspecified joint, not elsewhere classified: Secondary | ICD-10-CM | POA: Diagnosis not present

## 2021-05-18 DIAGNOSIS — M542 Cervicalgia: Secondary | ICD-10-CM | POA: Diagnosis not present

## 2021-06-02 DIAGNOSIS — R1084 Generalized abdominal pain: Secondary | ICD-10-CM | POA: Diagnosis not present

## 2021-06-02 DIAGNOSIS — R1030 Lower abdominal pain, unspecified: Secondary | ICD-10-CM | POA: Diagnosis not present

## 2021-06-10 DIAGNOSIS — K5790 Diverticulosis of intestine, part unspecified, without perforation or abscess without bleeding: Secondary | ICD-10-CM | POA: Diagnosis not present

## 2021-06-10 DIAGNOSIS — R198 Other specified symptoms and signs involving the digestive system and abdomen: Secondary | ICD-10-CM | POA: Diagnosis not present

## 2021-06-10 DIAGNOSIS — K219 Gastro-esophageal reflux disease without esophagitis: Secondary | ICD-10-CM | POA: Diagnosis not present

## 2021-06-10 DIAGNOSIS — R109 Unspecified abdominal pain: Secondary | ICD-10-CM | POA: Diagnosis not present

## 2021-07-01 DIAGNOSIS — F3181 Bipolar II disorder: Secondary | ICD-10-CM | POA: Diagnosis not present

## 2021-07-08 DIAGNOSIS — E669 Obesity, unspecified: Secondary | ICD-10-CM | POA: Diagnosis not present

## 2021-07-08 DIAGNOSIS — Z6830 Body mass index (BMI) 30.0-30.9, adult: Secondary | ICD-10-CM | POA: Diagnosis not present

## 2021-07-08 DIAGNOSIS — M1991 Primary osteoarthritis, unspecified site: Secondary | ICD-10-CM | POA: Diagnosis not present

## 2021-07-08 DIAGNOSIS — L409 Psoriasis, unspecified: Secondary | ICD-10-CM | POA: Diagnosis not present

## 2021-07-08 DIAGNOSIS — M79641 Pain in right hand: Secondary | ICD-10-CM | POA: Diagnosis not present

## 2021-07-26 DIAGNOSIS — E782 Mixed hyperlipidemia: Secondary | ICD-10-CM | POA: Diagnosis not present

## 2021-07-26 DIAGNOSIS — E1169 Type 2 diabetes mellitus with other specified complication: Secondary | ICD-10-CM | POA: Diagnosis not present

## 2021-08-05 DIAGNOSIS — Z96652 Presence of left artificial knee joint: Secondary | ICD-10-CM | POA: Diagnosis not present

## 2021-08-10 DIAGNOSIS — K219 Gastro-esophageal reflux disease without esophagitis: Secondary | ICD-10-CM | POA: Diagnosis not present

## 2021-08-10 DIAGNOSIS — E1169 Type 2 diabetes mellitus with other specified complication: Secondary | ICD-10-CM | POA: Diagnosis not present

## 2021-08-10 DIAGNOSIS — M810 Age-related osteoporosis without current pathological fracture: Secondary | ICD-10-CM | POA: Diagnosis not present

## 2021-08-10 DIAGNOSIS — J309 Allergic rhinitis, unspecified: Secondary | ICD-10-CM | POA: Diagnosis not present

## 2021-08-10 DIAGNOSIS — G8929 Other chronic pain: Secondary | ICD-10-CM | POA: Diagnosis not present

## 2021-08-10 DIAGNOSIS — I10 Essential (primary) hypertension: Secondary | ICD-10-CM | POA: Diagnosis not present

## 2021-08-10 DIAGNOSIS — E785 Hyperlipidemia, unspecified: Secondary | ICD-10-CM | POA: Diagnosis not present

## 2021-08-10 DIAGNOSIS — R300 Dysuria: Secondary | ICD-10-CM | POA: Diagnosis not present

## 2021-08-10 DIAGNOSIS — F325 Major depressive disorder, single episode, in full remission: Secondary | ICD-10-CM | POA: Diagnosis not present

## 2021-08-10 DIAGNOSIS — E782 Mixed hyperlipidemia: Secondary | ICD-10-CM | POA: Diagnosis not present

## 2021-08-10 DIAGNOSIS — N1831 Chronic kidney disease, stage 3a: Secondary | ICD-10-CM | POA: Diagnosis not present

## 2021-08-10 DIAGNOSIS — M79602 Pain in left arm: Secondary | ICD-10-CM | POA: Diagnosis not present

## 2021-09-13 DIAGNOSIS — L03113 Cellulitis of right upper limb: Secondary | ICD-10-CM | POA: Diagnosis not present

## 2021-09-13 DIAGNOSIS — R110 Nausea: Secondary | ICD-10-CM | POA: Diagnosis not present

## 2021-10-04 DIAGNOSIS — R21 Rash and other nonspecific skin eruption: Secondary | ICD-10-CM | POA: Diagnosis not present

## 2021-11-02 DIAGNOSIS — M4802 Spinal stenosis, cervical region: Secondary | ICD-10-CM | POA: Diagnosis not present

## 2021-11-03 DIAGNOSIS — L409 Psoriasis, unspecified: Secondary | ICD-10-CM | POA: Diagnosis not present

## 2021-11-03 DIAGNOSIS — M79641 Pain in right hand: Secondary | ICD-10-CM | POA: Diagnosis not present

## 2021-11-03 DIAGNOSIS — Z6829 Body mass index (BMI) 29.0-29.9, adult: Secondary | ICD-10-CM | POA: Diagnosis not present

## 2021-11-03 DIAGNOSIS — M1991 Primary osteoarthritis, unspecified site: Secondary | ICD-10-CM | POA: Diagnosis not present

## 2021-11-03 DIAGNOSIS — E663 Overweight: Secondary | ICD-10-CM | POA: Diagnosis not present

## 2021-11-03 DIAGNOSIS — M064 Inflammatory polyarthropathy: Secondary | ICD-10-CM | POA: Diagnosis not present

## 2021-11-07 DIAGNOSIS — N3090 Cystitis, unspecified without hematuria: Secondary | ICD-10-CM | POA: Diagnosis not present

## 2021-11-22 ENCOUNTER — Ambulatory Visit: Payer: Medicare Other | Attending: Cardiology

## 2021-11-22 ENCOUNTER — Encounter: Payer: Self-pay | Admitting: Cardiology

## 2021-11-22 ENCOUNTER — Ambulatory Visit: Payer: Medicare Other | Attending: Cardiology | Admitting: Cardiology

## 2021-11-22 VITALS — BP 124/76 | HR 74 | Ht 61.0 in | Wt 161.4 lb

## 2021-11-22 DIAGNOSIS — I10 Essential (primary) hypertension: Secondary | ICD-10-CM

## 2021-11-22 DIAGNOSIS — M79643 Pain in unspecified hand: Secondary | ICD-10-CM | POA: Insufficient documentation

## 2021-11-22 DIAGNOSIS — E785 Hyperlipidemia, unspecified: Secondary | ICD-10-CM

## 2021-11-22 DIAGNOSIS — R002 Palpitations: Secondary | ICD-10-CM | POA: Insufficient documentation

## 2021-11-22 DIAGNOSIS — R42 Dizziness and giddiness: Secondary | ICD-10-CM

## 2021-11-22 DIAGNOSIS — R0609 Other forms of dyspnea: Secondary | ICD-10-CM

## 2021-11-22 NOTE — Progress Notes (Unsigned)
Cardiology Consultation:    Date:  11/22/2021   ID:  [name], DOB [birth year], MRN [id number]  PCP:  Leonides Sake, MD  Cardiologist:  Jenne Campus, MD   Referring MD: Leonides Sake, MD   Chief Complaint  Patient presents with   Dizziness    Happens in the afternoon, For years she had episodes where she turns pale in color that last for days.     History of Present Illness:    [name] is a 71 y.o. female who is being seen today for the evaluation of dizziness at the request of Hamrick, Maura L, MD. past medical history significant for essential hypertension, arthritis, multiple surgery on her knees as well as her shoulder also a year ago or so she got extensive neck surgery.  She was referred to [place] because of episode of dizziness.  She said it happen a few times a day she suddenly became kind of unsteady to the point that she had to lean against something otherwise she can fell down.  She does not feel like she is going to pass just dizziness and unsteadiness.  That happen also when she is standing.  It happens very rarely when she is sitting but not when she is laying down.  Changing position of her body does not make much difference.  She described to have some skipped beats when it happened but also had skipped beats independently.  Years ago she was told that she had mitral valve prolapse.  Then she had echocardiogram done which apparently confirmed that.  But no need to intervene at that time.  She is trying to be active she is very worried and concerned about how she walks how she moves because of multiple joints issues.  Therefore she does not exercise on the regular basis.  Past Medical History:  Diagnosis Date   Anemia    Anxiety    Arthritis    Asthma    Cancer ([place]) 2002   Squamous Carcinoma   Depression    GERD (gastroesophageal reflux disease)    Heart murmur    Mitral Valve prolapse   History of kidney stones    Hypertension    Pneumonia    PONV  (postoperative nausea and vomiting) 2003   Too much anesthesia; hard to wake up/coma; N/V   Pre-diabetes     Past Surgical History:  Procedure Laterality Date   ABDOMINAL HYSTERECTOMY     ANTERIOR CERVICAL DECOMP/DISCECTOMY FUSION N/A 07/19/2019   Procedure: Anterior Cerivcal Discectomy Fusion - Cervical Four-Cervical Five - Cervical Five- Cervical Six - Cervical Six- Cervical Seven;  Surgeon: Earnie Larsson, MD;  Location: [place];  Service: Neurosurgery;  Laterality: N/A;  Anterior Cerivcal Discectomy Fusion - Cervical Four-Cervical Five - Cervical Five- Cervical Six - Cervical Six- Cervical Seven   EYE SURGERY  2020   Cataract lens replacement   FRACTURE SURGERY Left 2003   Humerous fracture   JOINT REPLACEMENT     right and left knee replacement   kidney stone removed     TONSILLECTOMY     TOTAL KNEE REVISION Right 05/30/2016   Procedure: TOTAL KNEE REVISION;  Surgeon: Vickey Huger, MD;  Location: [place];  Service: Orthopedics;  Laterality: Right;   WRIST ARTHROPLASTY Left     Current Medications: Current Meds  Medication Sig   albuterol (PROVENTIL HFA;VENTOLIN HFA) 108 (90 BASE) MCG/ACT inhaler Inhale 2 puffs into the lungs every 6 (six) hours as needed for wheezing or shortness  of breath.   ALPRAZolam (XANAX) 0.5 MG tablet Take 0.5 mg by mouth at bedtime.   amLODipine-benazepril (LOTREL) 5-40 MG capsule Take 1 capsule by mouth at bedtime.    amoxicillin-clavulanate (AUGMENTIN) 875-125 MG tablet Take 1 tablet by mouth every 12 (twelve) hours.   busPIRone (BUSPAR) 15 MG tablet Take 15 mg by mouth 2 (two) times daily as needed (anxiety).   Calcium Citrate-Vitamin D (CITRACAL + D PO) Take 400 mg by mouth daily with supper.   denosumab (PROLIA) 60 MG/ML SOLN injection Inject 60 mg into the skin every 6 (six) months.   EPINEPHrine 0.3 mg/0.3 mL IJ SOAJ injection Inject 0.3 mg into the muscle once as needed for anaphylaxis.    famotidine (PEPCID) 40 MG tablet Take 40 mg by mouth at bedtime.     fexofenadine (ALLEGRA) 180 MG tablet TAKE 1 TABLET DAILY (Patient taking differently: Take 180 mg by mouth daily with supper.)   fluticasone (FLONASE) 50 MCG/ACT nasal spray Place 1 spray into both nostrils as needed for allergies or rhinitis.   gabapentin (NEURONTIN) 300 MG capsule Take 300 mg by mouth 2 (two) times daily as needed (nerve pain).    HYDROcodone-acetaminophen (NORCO/VICODIN) 5-325 MG tablet Take 1 tablet by mouth every 6 (six) hours as needed for moderate pain.   methocarbamol (ROBAXIN) 500 MG tablet Take 1 tablet (500 mg total) by mouth 4 (four) times daily. (Patient taking differently: Take 500 mg by mouth 4 (four) times daily as needed for muscle spasms.)   methylPREDNISolone (MEDROL DOSEPAK) 4 MG TBPK tablet follow package directions (Patient taking differently: Take 4 mg by mouth as directed. follow package directions)   montelukast (SINGULAIR) 10 MG tablet Take 10 mg by mouth daily with supper.    OLANZapine (ZYPREXA) 5 MG tablet Take 5 mg by mouth at bedtime.   Olopatadine HCl (PATADAY OP) Place 1 drop into both eyes daily.   ondansetron (ZOFRAN) 4 MG tablet Take 1 tablet (4 mg total) by mouth every 8 (eight) hours as needed for nausea or vomiting.   polyvinyl alcohol (ARTIFICIAL TEARS) 1.4 % ophthalmic solution Place 1 drop into both eyes 2 (two) times daily.   pravastatin (PRAVACHOL) 40 MG tablet Take 40 mg by mouth at bedtime.   [DISCONTINUED] methocarbamol (ROBAXIN) 500 MG tablet Take 1-2 tablets (500-1,000 mg total) by mouth every 6 (six) hours as needed for muscle spasms.     Allergies:   Patient has no known allergies.   Social History   Socioeconomic History   Marital status: Married    Spouse name: Not on file   Number of children: Not on file   Years of education: Not on file   Highest education level: Not on file  Occupational History   Not on file  Tobacco Use   Smoking status: Never   Smokeless tobacco: Never  Vaping Use   Vaping Use: Never used   Substance and Sexual Activity   Alcohol use: No   Drug use: No   Sexual activity: Not on file  Other Topics Concern   Not on file  Social History Narrative   Not on file   Social Determinants of Health   Financial Resource Strain: Not on file  Food Insecurity: Not on file  Transportation Needs: Not on file  Physical Activity: Not on file  Stress: Not on file  Social Connections: Not on file     Family History: The patient's family history includes Heart attack in her brother. ROS:  Please see the history of present illness.    All 14 point review of systems negative except as described per history of present illness.  EKGs/Labs/Other Studies Reviewed:    The following studies were reviewed today:   EKG:  EKG is  ordered today.  The ekg ordered today demonstrates normal sinus rhythm normal P interval normal QS complex duration morphology no ST segment changes  Recent Labs: 01/02/2021: ALT 9; BUN 21; Creatinine, Ser 1.04; Hemoglobin 12.9; Platelets 276; Potassium 4.0; Sodium 137  Recent Lipid Panel No results found for: "CHOL", "TRIG", "HDL", "CHOLHDL", "VLDL", "LDLCALC", "LDLDIRECT"  Physical Exam:    VS:  BP 124/76   Pulse 74   Ht '5\' 1"'$  (1.549 m)   Wt 161 lb 6.4 oz (73.2 kg)   SpO2 96%   BMI 30.50 kg/m     Wt Readings from Last 3 Encounters:  11/22/21 161 lb 6.4 oz (73.2 kg)  07/22/19 181 lb 7 oz (82.3 kg)  07/19/19 171 lb (77.6 kg)     GEN:  Well nourished, well developed in no acute distress HEENT: Normal NECK: No JVD; No carotid bruits LYMPHATICS: No lymphadenopathy CARDIAC: RRR, no murmurs, no rubs, no gallops RESPIRATORY:  Clear to auscultation without rales, wheezing or rhonchi  ABDOMEN: Soft, non-tender, non-distended MUSCULOSKELETAL:  No edema; No deformity  SKIN: Warm and dry NEUROLOGIC:  Alert and oriented x 3 PSYCHIATRIC:  Normal affect   ASSESSMENT:    1. Essential hypertension   2. Dizziness   3. Palpitations   4. Dyslipidemia     PLAN:    In order of problems listed above:  Dizziness.  I will ask her to wear Zio patch make sure that her dizziness is not related to arrhythmia.  As a part of evaluation she will have an echocardiogram done to make sure structurally heart is normal.  I will not initiate any therapy since we do not have a diagnosis yet.  Other potential explanation of her symptomatology could be vascular problem with circulation to her brain, therefore I will do carotic ultrasounds we will also look at vertebrals. Essential hypertension blood pressure seems to well controlled continue present management.  Dyslipidemia she is taking low intensity statin pravastatin, her LDL 72 HDL 46 this is from May of this year good cholesterol profile we will continue present management. Borderline diabetes.  I did review K PN which only her hemoglobin A1c is 6.1 it is a good number.  Continue encourage her to be a little more active.   Medication Adjustments/Labs and Tests Ordered: Current medicines are reviewed at length with the patient today.  Concerns regarding medicines are outlined above.  No orders of the defined types were placed in this encounter.  No orders of the defined types were placed in this encounter.   Signed, Park Liter, MD, [facility]. 11/22/2021 4:08 PM    [facility]

## 2021-11-22 NOTE — Patient Instructions (Signed)
Medication Instructions:  Your physician recommends that you continue on your current medications as directed. Please refer to the Current Medication list given to you today.  *If you need a refill on your cardiac medications before your next appointment, please call your pharmacy*   Lab Work: None Ordered If you have labs (blood work) drawn today and your tests are completely normal, you will receive your results only by: [place] (if you have MyChart) OR A paper copy in the mail If you have any lab test that is abnormal or we need to change your treatment, we will call you to review the results.   Testing/Procedures:  WHY IS MY DOCTOR PRESCRIBING ZIO? The Zio system is proven and trusted by physicians to detect and diagnose irregular heart rhythms -- and has been prescribed to hundreds of thousands of patients.  The FDA has cleared the Zio system to monitor for many different kinds of irregular heart rhythms. In a study, physicians were able to reach a diagnosis 90% of the time with the Zio system1.  You can wear the Zio monitor -- a small, discreet, comfortable patch -- during your normal day-to-day activity, including while you sleep, shower, and exercise, while it records every single heartbeat for analysis.  1Barrett, P., et al. Comparison of 24 Hour Holter Monitoring Versus 14 Day Novel Adhesive Patch Electrocardiographic Monitoring. [place], 2014.  ZIO VS. HOLTER MONITORING The Zio monitor can be comfortably worn for up to 14 days. Holter monitors can be worn for 24 to 48 hours, limiting the time to record any irregular heart rhythms you may have. Zio is able to capture data for the 51% of patients who have their first symptom-triggered arrhythmia after 48 hours.1  LIVE WITHOUT RESTRICTIONS The Zio ambulatory cardiac monitor is a small, unobtrusive, and water-resistant patch--you might even forget you're wearing it. The Zio monitor records and stores  every beat of your heart, whether you're sleeping, working out, or showering.    Your physician has requested that you have an echocardiogram. Echocardiography is a painless test that uses sound waves to create images of your heart. It provides your doctor with information about the size and shape of your heart and how well your heart's chambers and valves are working. This procedure takes approximately one hour. There are no restrictions for this procedure.   Your physician has requested that you have a carotid duplex. This test is an ultrasound of the carotid arteries in your neck. It looks at blood flow through these arteries that supply the brain with blood. Allow one hour for this exam. There are no restrictions or special instructions.    Follow-Up: At [facility], you and your health needs are our priority.  As part of our continuing mission to provide you with exceptional heart care, we have created designated Provider Care Teams.  These Care Teams include your primary Cardiologist (physician) and Advanced Practice Providers (APPs -  Physician Assistants and Nurse Practitioners) who all work together to provide you with the care you need, when you need it.  We recommend signing up for the patient portal called "MyChart".  Sign up information is provided on this After Visit Summary.  MyChart is used to connect with patients for Virtual Visits (Telemedicine).  Patients are able to view lab/test results, encounter notes, upcoming appointments, etc.  Non-urgent messages can be sent to your provider as well.   To learn more about what you can do with MyChart, go to [email or web address].  Your next appointment:   3 month(s)  The format for your next appointment:   In Person  Provider:   Jenne Campus, MD    Other Instructions NA

## 2021-12-08 ENCOUNTER — Ambulatory Visit (INDEPENDENT_AMBULATORY_CARE_PROVIDER_SITE_OTHER): Payer: Medicare Other

## 2021-12-08 ENCOUNTER — Ambulatory Visit: Payer: Medicare Other | Attending: Cardiology

## 2021-12-08 DIAGNOSIS — R0609 Other forms of dyspnea: Secondary | ICD-10-CM | POA: Insufficient documentation

## 2021-12-08 DIAGNOSIS — R42 Dizziness and giddiness: Secondary | ICD-10-CM | POA: Insufficient documentation

## 2021-12-08 LAB — ECHOCARDIOGRAM COMPLETE
Area-P 1/2: 2.11 cm2
S' Lateral: 3.2 cm

## 2021-12-10 ENCOUNTER — Telehealth: Payer: Self-pay

## 2021-12-10 NOTE — Telephone Encounter (Signed)
Results reviewed with pt as per Dr. Krasowski's note.  Pt verbalized understanding and had no additional questions. Routed to PCP  

## 2021-12-13 DIAGNOSIS — R002 Palpitations: Secondary | ICD-10-CM | POA: Diagnosis not present

## 2021-12-15 ENCOUNTER — Telehealth: Payer: Self-pay

## 2021-12-15 NOTE — Patient Outreach (Signed)
  Care Coordination   12/15/2021 Name: Breckin Zafar MRN: [id number] DOB: 03-09-1950   Care Coordination Outreach Attempts:  An unsuccessful telephone outreach was attempted today to offer the patient information about available care coordination services as a benefit of their health plan.   Follow Up Plan:  Additional outreach attempts will be made to offer the patient care coordination information and services.   Encounter Outcome:  No Answer  Care Coordination Interventions Activated:  No   Care Coordination Interventions:  No, not indicated    Tomasa Rand, RN, BSN, CEN [facility] [employer] [phone]

## 2021-12-20 ENCOUNTER — Telehealth: Payer: Self-pay

## 2021-12-20 NOTE — Patient Outreach (Signed)
  Care Coordination   12/20/2021 Name: [name] MRN: [id number] DOB: 1950-11-02   Care Coordination Outreach Attempts:  A second unsuccessful outreach was attempted today to offer the patient with information about available care coordination services as a benefit of their health plan.     Follow Up Plan:  Additional outreach attempts will be made to offer the patient care coordination information and services.   Encounter Outcome:  No Answer  Care Coordination Interventions Activated:  No   Care Coordination Interventions:  No, not indicated    Tomasa Rand, RN, BSN, CEN [facility] [employer] [phone]

## 2022-01-03 ENCOUNTER — Telehealth: Payer: Self-pay

## 2022-01-03 NOTE — Patient Outreach (Signed)
  Care Coordination   01/03/2022 Name: Laterra Lubinski MRN: [id number] DOB: Mar 25, 1950   Care Coordination Outreach Attempts:  A third unsuccessful outreach was attempted today to offer the patient with information about available care coordination services as a benefit of their health plan.   Follow Up Plan:  No further outreach attempts will be made at this time. We have been unable to contact the patient to offer or enroll patient in care coordination services  Encounter Outcome:  No Answer  Care Coordination Interventions Activated:  No   Care Coordination Interventions:  No, not indicated    Tomasa Rand, RN, BSN, CEN [facility] Coordinator [phone]

## 2022-01-04 ENCOUNTER — Telehealth: Payer: Self-pay

## 2022-01-04 NOTE — Telephone Encounter (Signed)
-----   Message from Park Liter, MD sent at 12/30/2021  5:57 PM EDT ----- Monitor showed multiple episode of supraventricular tachycardia but very short episodes, no need to treat triggered event showing sinus rhythm

## 2022-01-04 NOTE — Telephone Encounter (Signed)
Patient notified of results.

## 2022-01-04 NOTE — Telephone Encounter (Signed)
error 

## 2022-01-12 DIAGNOSIS — R0981 Nasal congestion: Secondary | ICD-10-CM | POA: Diagnosis not present

## 2022-01-12 DIAGNOSIS — R051 Acute cough: Secondary | ICD-10-CM | POA: Diagnosis not present

## 2022-01-12 DIAGNOSIS — J0100 Acute maxillary sinusitis, unspecified: Secondary | ICD-10-CM | POA: Diagnosis not present

## 2022-01-12 DIAGNOSIS — J3489 Other specified disorders of nose and nasal sinuses: Secondary | ICD-10-CM | POA: Diagnosis not present

## 2022-01-13 DIAGNOSIS — Z1231 Encounter for screening mammogram for malignant neoplasm of breast: Secondary | ICD-10-CM | POA: Diagnosis not present

## 2022-02-16 DIAGNOSIS — L03011 Cellulitis of right finger: Secondary | ICD-10-CM | POA: Diagnosis not present

## 2022-02-21 ENCOUNTER — Encounter: Payer: Self-pay | Admitting: Cardiology

## 2022-02-21 ENCOUNTER — Ambulatory Visit: Payer: Medicare Other | Attending: Cardiology | Admitting: Cardiology

## 2022-02-21 VITALS — BP 108/64 | HR 67 | Ht 61.0 in | Wt 157.0 lb

## 2022-02-21 DIAGNOSIS — I5189 Other ill-defined heart diseases: Secondary | ICD-10-CM

## 2022-02-21 DIAGNOSIS — E785 Hyperlipidemia, unspecified: Secondary | ICD-10-CM

## 2022-02-21 DIAGNOSIS — I10 Essential (primary) hypertension: Secondary | ICD-10-CM | POA: Diagnosis not present

## 2022-02-21 DIAGNOSIS — R42 Dizziness and giddiness: Secondary | ICD-10-CM | POA: Diagnosis not present

## 2022-02-21 DIAGNOSIS — R002 Palpitations: Secondary | ICD-10-CM | POA: Diagnosis not present

## 2022-02-21 NOTE — Progress Notes (Signed)
Cardiology Office Note:    Date:  02/21/2022   ID:  [name], DOB [birth year], MRN [id number]  PCP:  Leonides Sake, MD  Cardiologist:  Jenne Campus, MD    Referring MD: Leonides Sake, MD   Chief Complaint  Patient presents with   abnormal monitor     History of Present Illness:    [name] is a 71 y.o. female with past medical history significant for essential hypertension, arthritis, mood is postsurgical her knee and shoulder recently quite extensive surgery on her neck.  Since that time she been complaining of having dizziness.  Interested reason for referral.  She is doing well denies have any chest pain tightness squeezing pressure burning chest, she tells me the dizziness much better.  We did monitor which showed some very short episode of nonsustained supraventricular tachycardia which is completely asymptomatic, therefore, no need to treat, she did press button few times because of dizziness which showed absolutely normal sinus rhythm, echocardiogram showed preserved ejection fraction, carotic ultrasound showed normal coronary arteries.  Basically there is no cardiac explanation for her dizziness  Past Medical History:  Diagnosis Date   Anemia    Anxiety    Arthritis    Asthma    Cancer ([place]) 2002   Squamous Carcinoma   Depression    GERD (gastroesophageal reflux disease)    Heart murmur    Mitral Valve prolapse   History of kidney stones    Hypertension    Pneumonia    PONV (postoperative nausea and vomiting) 2003   Too much anesthesia; hard to wake up/coma; N/V   Pre-diabetes     Past Surgical History:  Procedure Laterality Date   ABDOMINAL HYSTERECTOMY     ANTERIOR CERVICAL DECOMP/DISCECTOMY FUSION N/A 07/19/2019   Procedure: Anterior Cerivcal Discectomy Fusion - Cervical Four-Cervical Five - Cervical Five- Cervical Six - Cervical Six- Cervical Seven;  Surgeon: Earnie Larsson, MD;  Location: [place];  Service: Neurosurgery;  Laterality: N/A;   Anterior Cerivcal Discectomy Fusion - Cervical Four-Cervical Five - Cervical Five- Cervical Six - Cervical Six- Cervical Seven   EYE SURGERY  2020   Cataract lens replacement   FRACTURE SURGERY Left 2003   Humerous fracture   JOINT REPLACEMENT     right and left knee replacement   kidney stone removed     TONSILLECTOMY     TOTAL KNEE REVISION Right 05/30/2016   Procedure: TOTAL KNEE REVISION;  Surgeon: Vickey Huger, MD;  Location: [place];  Service: Orthopedics;  Laterality: Right;   WRIST ARTHROPLASTY Left     Current Medications: Current Meds  Medication Sig   albuterol (PROVENTIL HFA;VENTOLIN HFA) 108 (90 BASE) MCG/ACT inhaler Inhale 2 puffs into the lungs every 6 (six) hours as needed for wheezing or shortness of breath.   ALPRAZolam (XANAX) 0.5 MG tablet Take 0.5 mg by mouth at bedtime.   amLODipine-benazepril (LOTREL) 5-40 MG capsule Take 1 capsule by mouth at bedtime.    amoxicillin-clavulanate (AUGMENTIN) 875-125 MG tablet Take 1 tablet by mouth every 12 (twelve) hours.   busPIRone (BUSPAR) 15 MG tablet Take 15 mg by mouth 2 (two) times daily as needed (anxiety).   Calcium Citrate-Vitamin D (CITRACAL + D PO) Take 400 mg by mouth daily with supper.   denosumab (PROLIA) 60 MG/ML SOLN injection Inject 60 mg into the skin every 6 (six) months.   EPINEPHrine 0.3 mg/0.3 mL IJ SOAJ injection Inject 0.3 mg into the muscle once as needed for anaphylaxis.  famotidine (PEPCID) 40 MG tablet Take 40 mg by mouth at bedtime.    fexofenadine (ALLEGRA) 180 MG tablet TAKE 1 TABLET DAILY (Patient taking differently: Take 180 mg by mouth daily with supper.)   fluticasone (FLONASE) 50 MCG/ACT nasal spray Place 1 spray into both nostrils as needed for allergies or rhinitis.   gabapentin (NEURONTIN) 300 MG capsule Take 300 mg by mouth 2 (two) times daily as needed (nerve pain).    HYDROcodone-acetaminophen (NORCO/VICODIN) 5-325 MG tablet Take 1 tablet by mouth every 6 (six) hours as needed for moderate  pain.   methocarbamol (ROBAXIN) 500 MG tablet Take 1 tablet (500 mg total) by mouth 4 (four) times daily. (Patient taking differently: Take 500 mg by mouth 4 (four) times daily as needed for muscle spasms.)   methylPREDNISolone (MEDROL DOSEPAK) 4 MG TBPK tablet follow package directions (Patient taking differently: Take 4 mg by mouth as directed. follow package directions)   montelukast (SINGULAIR) 10 MG tablet Take 10 mg by mouth daily with supper.    OLANZapine (ZYPREXA) 5 MG tablet Take 5 mg by mouth at bedtime.   Olopatadine HCl (PATADAY OP) Place 1 drop into both eyes daily.   ondansetron (ZOFRAN) 4 MG tablet Take 1 tablet (4 mg total) by mouth every 8 (eight) hours as needed for nausea or vomiting.   polyvinyl alcohol (ARTIFICIAL TEARS) 1.4 % ophthalmic solution Place 1 drop into both eyes 2 (two) times daily.   pravastatin (PRAVACHOL) 40 MG tablet Take 40 mg by mouth at bedtime.     Allergies:   Clindamycin   Social History   Socioeconomic History   Marital status: Married    Spouse name: Not on file   Number of children: Not on file   Years of education: Not on file   Highest education level: Not on file  Occupational History   Not on file  Tobacco Use   Smoking status: Never   Smokeless tobacco: Never  Vaping Use   Vaping Use: Never used  Substance and Sexual Activity   Alcohol use: No   Drug use: No   Sexual activity: Not on file  Other Topics Concern   Not on file  Social History Narrative   Not on file   Social Determinants of Health   Financial Resource Strain: Not on file  Food Insecurity: Not on file  Transportation Needs: Not on file  Physical Activity: Not on file  Stress: Not on file  Social Connections: Not on file     Family History: The patient's family history includes Heart attack in her brother. ROS:   Please see the history of present illness.    All 14 point review of systems negative except as described per history of present  illness  EKGs/Labs/Other Studies Reviewed:      Recent Labs: No results found for requested labs within last 365 days.  Recent Lipid Panel No results found for: "CHOL", "TRIG", "HDL", "CHOLHDL", "VLDL", "LDLCALC", "LDLDIRECT"  Physical Exam:    VS:  BP 108/64 (BP Location: Right Arm, Patient Position: Sitting)   Pulse 67   Ht '5\' 1"'$  (1.549 m)   Wt 157 lb (71.2 kg)   SpO2 95%   BMI 29.66 kg/m     Wt Readings from Last 3 Encounters:  02/21/22 157 lb (71.2 kg)  11/22/21 161 lb 6.4 oz (73.2 kg)  07/22/19 181 lb 7 oz (82.3 kg)     GEN:  Well nourished, well developed in no acute distress HEENT: Normal  NECK: No JVD; No carotid bruits LYMPHATICS: No lymphadenopathy CARDIAC: RRR, no murmurs, no rubs, no gallops RESPIRATORY:  Clear to auscultation without rales, wheezing or rhonchi  ABDOMEN: Soft, non-tender, non-distended MUSCULOSKELETAL:  No edema; No deformity  SKIN: Warm and dry LOWER EXTREMITIES: no swelling NEUROLOGIC:  Alert and oriented x 3 PSYCHIATRIC:  Normal affect   ASSESSMENT:    1. Dizziness   2. Dyslipidemia   3. Palpitations   4. Essential hypertension   5. Left ventricular diastolic dysfunction, NYHA class 2    PLAN:    In order of problems listed above:  Dizziness unclear.  She started having this after extensive neck surgery, so far cardiac testing negative.  She is doing quite well right now we did I ask him if she still having problem in the future she need to follow-up with ENT This edema did review K PN which show me her LDL 72 HDL 46 is a good cholesterol profile we will continue present management. History of mitral valve prolapse I do not see this on the echocardiogram.  Continue monitoring Essential hypertension blood pressure well-controlled   Medication Adjustments/Labs and Tests Ordered: Current medicines are reviewed at length with the patient today.  Concerns regarding medicines are outlined above.  No orders of the defined types were  placed in this encounter.  Medication changes: No orders of the defined types were placed in this encounter.   Signed, Park Liter, MD, [facility] 02/21/2022 2:42 PM     [facility]

## 2022-02-21 NOTE — Patient Instructions (Signed)

## 2022-02-22 DIAGNOSIS — E782 Mixed hyperlipidemia: Secondary | ICD-10-CM | POA: Diagnosis not present

## 2022-02-22 DIAGNOSIS — E1169 Type 2 diabetes mellitus with other specified complication: Secondary | ICD-10-CM | POA: Diagnosis not present

## 2022-02-24 DIAGNOSIS — G8929 Other chronic pain: Secondary | ICD-10-CM | POA: Diagnosis not present

## 2022-02-24 DIAGNOSIS — M810 Age-related osteoporosis without current pathological fracture: Secondary | ICD-10-CM | POA: Diagnosis not present

## 2022-02-24 DIAGNOSIS — N1831 Chronic kidney disease, stage 3a: Secondary | ICD-10-CM | POA: Diagnosis not present

## 2022-02-24 DIAGNOSIS — E785 Hyperlipidemia, unspecified: Secondary | ICD-10-CM | POA: Diagnosis not present

## 2022-02-24 DIAGNOSIS — K219 Gastro-esophageal reflux disease without esophagitis: Secondary | ICD-10-CM | POA: Diagnosis not present

## 2022-02-24 DIAGNOSIS — E782 Mixed hyperlipidemia: Secondary | ICD-10-CM | POA: Diagnosis not present

## 2022-02-24 DIAGNOSIS — I10 Essential (primary) hypertension: Secondary | ICD-10-CM | POA: Diagnosis not present

## 2022-02-24 DIAGNOSIS — M79602 Pain in left arm: Secondary | ICD-10-CM | POA: Diagnosis not present

## 2022-02-24 DIAGNOSIS — J309 Allergic rhinitis, unspecified: Secondary | ICD-10-CM | POA: Diagnosis not present

## 2022-02-24 DIAGNOSIS — F325 Major depressive disorder, single episode, in full remission: Secondary | ICD-10-CM | POA: Diagnosis not present

## 2022-02-24 DIAGNOSIS — L4050 Arthropathic psoriasis, unspecified: Secondary | ICD-10-CM | POA: Diagnosis not present

## 2022-02-24 DIAGNOSIS — E1169 Type 2 diabetes mellitus with other specified complication: Secondary | ICD-10-CM | POA: Diagnosis not present

## 2022-03-03 DIAGNOSIS — J45909 Unspecified asthma, uncomplicated: Secondary | ICD-10-CM | POA: Diagnosis not present

## 2022-03-03 DIAGNOSIS — R0602 Shortness of breath: Secondary | ICD-10-CM | POA: Diagnosis not present

## 2022-03-04 DIAGNOSIS — M810 Age-related osteoporosis without current pathological fracture: Secondary | ICD-10-CM | POA: Diagnosis not present

## 2022-03-16 DIAGNOSIS — F3181 Bipolar II disorder: Secondary | ICD-10-CM | POA: Diagnosis not present

## 2022-03-16 DIAGNOSIS — F332 Major depressive disorder, recurrent severe without psychotic features: Secondary | ICD-10-CM | POA: Diagnosis not present

## 2022-03-17 DIAGNOSIS — H26493 Other secondary cataract, bilateral: Secondary | ICD-10-CM | POA: Diagnosis not present

## 2022-04-12 DIAGNOSIS — M19041 Primary osteoarthritis, right hand: Secondary | ICD-10-CM | POA: Diagnosis not present

## 2022-04-12 DIAGNOSIS — M79641 Pain in right hand: Secondary | ICD-10-CM | POA: Diagnosis not present

## 2022-06-06 DIAGNOSIS — M19041 Primary osteoarthritis, right hand: Secondary | ICD-10-CM | POA: Insufficient documentation

## 2022-06-17 DIAGNOSIS — J45909 Unspecified asthma, uncomplicated: Secondary | ICD-10-CM | POA: Diagnosis not present

## 2022-06-17 DIAGNOSIS — I10 Essential (primary) hypertension: Secondary | ICD-10-CM | POA: Diagnosis not present

## 2022-06-17 DIAGNOSIS — K219 Gastro-esophageal reflux disease without esophagitis: Secondary | ICD-10-CM | POA: Diagnosis not present

## 2022-06-17 DIAGNOSIS — Z6832 Body mass index (BMI) 32.0-32.9, adult: Secondary | ICD-10-CM | POA: Diagnosis not present

## 2022-06-17 DIAGNOSIS — M19041 Primary osteoarthritis, right hand: Secondary | ICD-10-CM | POA: Diagnosis not present

## 2022-06-17 DIAGNOSIS — E669 Obesity, unspecified: Secondary | ICD-10-CM | POA: Diagnosis not present

## 2022-06-20 DIAGNOSIS — M19041 Primary osteoarthritis, right hand: Secondary | ICD-10-CM | POA: Diagnosis not present

## 2022-06-29 DIAGNOSIS — Z981 Arthrodesis status: Secondary | ICD-10-CM | POA: Diagnosis not present

## 2022-06-29 DIAGNOSIS — M19041 Primary osteoarthritis, right hand: Secondary | ICD-10-CM | POA: Diagnosis not present

## 2022-06-29 DIAGNOSIS — Z967 Presence of other bone and tendon implants: Secondary | ICD-10-CM | POA: Diagnosis not present

## 2022-06-30 DIAGNOSIS — R198 Other specified symptoms and signs involving the digestive system and abdomen: Secondary | ICD-10-CM | POA: Diagnosis not present

## 2022-06-30 DIAGNOSIS — K219 Gastro-esophageal reflux disease without esophagitis: Secondary | ICD-10-CM | POA: Diagnosis not present

## 2022-06-30 DIAGNOSIS — R109 Unspecified abdominal pain: Secondary | ICD-10-CM | POA: Diagnosis not present

## 2022-06-30 DIAGNOSIS — K5790 Diverticulosis of intestine, part unspecified, without perforation or abscess without bleeding: Secondary | ICD-10-CM | POA: Diagnosis not present

## 2022-07-26 DIAGNOSIS — M19041 Primary osteoarthritis, right hand: Secondary | ICD-10-CM | POA: Diagnosis not present

## 2022-07-26 DIAGNOSIS — Z4789 Encounter for other orthopedic aftercare: Secondary | ICD-10-CM | POA: Diagnosis not present

## 2022-08-24 ENCOUNTER — Ambulatory Visit: Payer: Medicare Other | Attending: Cardiology | Admitting: Cardiology

## 2022-08-24 ENCOUNTER — Encounter: Payer: Self-pay | Admitting: Cardiology

## 2022-08-24 VITALS — BP 130/70 | HR 88 | Ht 61.0 in | Wt 168.2 lb

## 2022-08-24 DIAGNOSIS — K219 Gastro-esophageal reflux disease without esophagitis: Secondary | ICD-10-CM | POA: Insufficient documentation

## 2022-08-24 DIAGNOSIS — M5412 Radiculopathy, cervical region: Secondary | ICD-10-CM | POA: Diagnosis not present

## 2022-08-24 DIAGNOSIS — E785 Hyperlipidemia, unspecified: Secondary | ICD-10-CM | POA: Insufficient documentation

## 2022-08-24 DIAGNOSIS — I10 Essential (primary) hypertension: Secondary | ICD-10-CM | POA: Insufficient documentation

## 2022-08-24 NOTE — Progress Notes (Signed)
Cardiology Office Note:    Date:  08/24/2022   ID:  [name], DOB 03-05-1951, MRN [id number]  PCP:  Ailene Ravel, MD  Cardiologist:  Gypsy Balsam, MD    Referring MD: Ailene Ravel, MD   Chief Complaint  Patient presents with   Follow-up    History of Present Illness:    [name] is a 72 y.o. female  With past medical history significant for essential hypertension, dyslipidemia, arthritis with extensive surgical intervention multiple joints initially referred to [place] because of episode of dizziness.  Cardiac workup have been negative monitor did not show any significant arrhythmia even though she presents back to many times, echocardiogram did not show any significant pathology even though she does have history of mitral valve problems but was not observed from the last echocardiogram.  Since I have seen her last time which was in December she is doing much better dizziness improved.  Conclusion is that probably that was related to the extensive neck surgery that she had.  He is very happy the way she feels she tried to walk and be a little more active.  Denies have any chest pain tightness squeezing pressure burning chest.  She does have a garden and she is enjoying working in  Past Medical History:  Diagnosis Date   Anemia    Anxiety    Arthritis    Asthma    Cancer (HCC) 2002   Squamous Carcinoma   Depression    GERD (gastroesophageal reflux disease)    Heart murmur    Mitral Valve prolapse   History of kidney stones    Hypertension    Pneumonia    PONV (postoperative nausea and vomiting) 2003   Too much anesthesia; hard to wake up/coma; N/V   Pre-diabetes     Past Surgical History:  Procedure Laterality Date   ABDOMINAL HYSTERECTOMY     ANTERIOR CERVICAL DECOMP/DISCECTOMY FUSION N/A 07/19/2019   Procedure: Anterior Cerivcal Discectomy Fusion - Cervical Four-Cervical Five - Cervical Five- Cervical Six - Cervical Six- Cervical Seven;  Surgeon: Julio Sicks, MD;  Location: [facility] OR;  Service: Neurosurgery;  Laterality: N/A;  Anterior Cerivcal Discectomy Fusion - Cervical Four-Cervical Five - Cervical Five- Cervical Six - Cervical Six- Cervical Seven   EYE SURGERY  2020   Cataract lens replacement   FRACTURE SURGERY Left 2003   Humerous fracture   JOINT REPLACEMENT     right and left knee replacement   kidney stone removed     TONSILLECTOMY     TOTAL KNEE REVISION Right 05/30/2016   Procedure: TOTAL KNEE REVISION;  Surgeon: Dannielle Huh, MD;  Location: [facility] OR;  Service: Orthopedics;  Laterality: Right;   WRIST ARTHROPLASTY Left     Current Medications: Current Meds  Medication Sig   albuterol (PROVENTIL HFA;VENTOLIN HFA) 108 (90 BASE) MCG/ACT inhaler Inhale 2 puffs into the lungs every 6 (six) hours as needed for wheezing or shortness of breath.   amLODipine-benazepril (LOTREL) 5-40 MG capsule Take 1 capsule by mouth at bedtime.    aspirin EC 81 MG tablet Take 81 mg by mouth daily.   busPIRone (BUSPAR) 15 MG tablet Take 15 mg by mouth 2 (two) times daily as needed (anxiety).   Calcium Citrate-Vitamin D (CITRACAL + D PO) Take 400 mg by mouth daily with supper.   denosumab (PROLIA) 60 MG/ML SOLN injection Inject 60 mg into the skin every 6 (six) months.   famotidine (PEPCID) 40 MG tablet Take 40 mg by  mouth at bedtime.    fexofenadine (ALLEGRA) 180 MG tablet TAKE 1 TABLET DAILY (Patient taking differently: Take 180 mg by mouth daily with supper.)   fluticasone (FLONASE) 50 MCG/ACT nasal spray Place 1 spray into both nostrils as needed for allergies or rhinitis.   gabapentin (NEURONTIN) 300 MG capsule Take 300 mg by mouth 2 (two) times daily as needed (nerve pain).    montelukast (SINGULAIR) 10 MG tablet Take 10 mg by mouth daily with supper.    Olopatadine HCl (PATADAY OP) Place 1 drop into both eyes daily.   polyvinyl alcohol (ARTIFICIAL TEARS) 1.4 % ophthalmic solution Place 1 drop into both eyes 2 (two) times daily.   [DISCONTINUED]  ALPRAZolam (XANAX) 0.5 MG tablet Take 0.5 mg by mouth at bedtime.   [DISCONTINUED] amoxicillin-clavulanate (AUGMENTIN) 875-125 MG tablet Take 1 tablet by mouth every 12 (twelve) hours.   [DISCONTINUED] EPINEPHrine 0.3 mg/0.3 mL IJ SOAJ injection Inject 0.3 mg into the muscle once as needed for anaphylaxis.    [DISCONTINUED] HYDROcodone-acetaminophen (NORCO/VICODIN) 5-325 MG tablet Take 1 tablet by mouth every 6 (six) hours as needed for moderate pain.   [DISCONTINUED] methocarbamol (ROBAXIN) 500 MG tablet Take 1 tablet (500 mg total) by mouth 4 (four) times daily. (Patient taking differently: Take 500 mg by mouth 4 (four) times daily as needed for muscle spasms.)   [DISCONTINUED] methylPREDNISolone (MEDROL DOSEPAK) 4 MG TBPK tablet follow package directions (Patient taking differently: Take 4 mg by mouth as directed. follow package directions)   [DISCONTINUED] OLANZapine (ZYPREXA) 5 MG tablet Take 5 mg by mouth at bedtime.   [DISCONTINUED] ondansetron (ZOFRAN) 4 MG tablet Take 1 tablet (4 mg total) by mouth every 8 (eight) hours as needed for nausea or vomiting.   [DISCONTINUED] pravastatin (PRAVACHOL) 40 MG tablet Take 40 mg by mouth at bedtime.     Allergies:   Patient has no known allergies.   Social History   Socioeconomic History   Marital status: Married    Spouse name: Not on file   Number of children: Not on file   Years of education: Not on file   Highest education level: Not on file  Occupational History   Not on file  Tobacco Use   Smoking status: Never   Smokeless tobacco: Never  Vaping Use   Vaping Use: Never used  Substance and Sexual Activity   Alcohol use: No   Drug use: No   Sexual activity: Not on file  Other Topics Concern   Not on file  Social History Narrative   Not on file   Social Determinants of Health   Financial Resource Strain: Not on file  Food Insecurity: Not on file  Transportation Needs: Not on file  Physical Activity: Not on file  Stress: Not  on file  Social Connections: Not on file     Family History: The patient's family history includes Heart attack in her brother. ROS:   Please see the history of present illness.    All 14 point review of systems negative except as described per history of present illness  EKGs/Labs/Other Studies Reviewed:      Recent Labs: No results found for requested labs within last 365 days.  Recent Lipid Panel No results found for: "CHOL", "TRIG", "HDL", "CHOLHDL", "VLDL", "LDLCALC", "LDLDIRECT"  Physical Exam:    VS:  BP 130/70 (BP Location: Left Arm, Patient Position: Sitting)   Pulse 88   Ht 5\' 1"  (1.549 m)   Wt 168 lb 3.2 oz (76.3 kg)  SpO2 92%   BMI 31.78 kg/m     Wt Readings from Last 3 Encounters:  08/24/22 168 lb 3.2 oz (76.3 kg)  02/21/22 157 lb (71.2 kg)  11/22/21 161 lb 6.4 oz (73.2 kg)     GEN:  Well nourished, well developed in no acute distress HEENT: Normal NECK: No JVD; No carotid bruits LYMPHATICS: No lymphadenopathy CARDIAC: RRR, no murmurs, no rubs, no gallops RESPIRATORY:  Clear to auscultation without rales, wheezing or rhonchi  ABDOMEN: Soft, non-tender, non-distended MUSCULOSKELETAL:  No edema; No deformity  SKIN: Warm and dry LOWER EXTREMITIES: no swelling NEUROLOGIC:  Alert and oriented x 3 PSYCHIATRIC:  Normal affect   ASSESSMENT:    1. Essential hypertension   2. Gastroesophageal reflux disease, unspecified whether esophagitis present   3. Cervical radiculitis   4. Dyslipidemia    PLAN:    In order of problems listed above:  Essential hypertension blood pressure well-controlled continue present management. Dyslipidemia I did review K PN which show me data from 22 February 2022 with LDL of 69 HDL 56.  Will continue present management. Cervical problem doing well improving. Gastroesophageal reflux disease asymptomatic.   Medication Adjustments/Labs and Tests Ordered: Current medicines are reviewed at length with the patient today.   Concerns regarding medicines are outlined above.  No orders of the defined types were placed in this encounter.  Medication changes: No orders of the defined types were placed in this encounter.   Signed, Georgeanna Lea, MD, [facility] 08/24/2022 9:16 AM    [facility]

## 2022-08-24 NOTE — Patient Instructions (Signed)

## 2022-08-29 DIAGNOSIS — E1169 Type 2 diabetes mellitus with other specified complication: Secondary | ICD-10-CM | POA: Diagnosis not present

## 2022-08-29 DIAGNOSIS — E782 Mixed hyperlipidemia: Secondary | ICD-10-CM | POA: Diagnosis not present

## 2022-08-31 DIAGNOSIS — J309 Allergic rhinitis, unspecified: Secondary | ICD-10-CM | POA: Diagnosis not present

## 2022-08-31 DIAGNOSIS — Z139 Encounter for screening, unspecified: Secondary | ICD-10-CM | POA: Diagnosis not present

## 2022-08-31 DIAGNOSIS — G8929 Other chronic pain: Secondary | ICD-10-CM | POA: Diagnosis not present

## 2022-08-31 DIAGNOSIS — E1169 Type 2 diabetes mellitus with other specified complication: Secondary | ICD-10-CM | POA: Diagnosis not present

## 2022-08-31 DIAGNOSIS — M810 Age-related osteoporosis without current pathological fracture: Secondary | ICD-10-CM | POA: Diagnosis not present

## 2022-08-31 DIAGNOSIS — I10 Essential (primary) hypertension: Secondary | ICD-10-CM | POA: Diagnosis not present

## 2022-08-31 DIAGNOSIS — E785 Hyperlipidemia, unspecified: Secondary | ICD-10-CM | POA: Diagnosis not present

## 2022-08-31 DIAGNOSIS — N182 Chronic kidney disease, stage 2 (mild): Secondary | ICD-10-CM | POA: Diagnosis not present

## 2022-08-31 DIAGNOSIS — E782 Mixed hyperlipidemia: Secondary | ICD-10-CM | POA: Diagnosis not present

## 2022-08-31 DIAGNOSIS — Z9181 History of falling: Secondary | ICD-10-CM | POA: Diagnosis not present

## 2022-08-31 DIAGNOSIS — M79602 Pain in left arm: Secondary | ICD-10-CM | POA: Diagnosis not present

## 2022-08-31 DIAGNOSIS — K219 Gastro-esophageal reflux disease without esophagitis: Secondary | ICD-10-CM | POA: Diagnosis not present

## 2022-09-07 DIAGNOSIS — Z981 Arthrodesis status: Secondary | ICD-10-CM | POA: Diagnosis not present

## 2022-09-07 DIAGNOSIS — M19041 Primary osteoarthritis, right hand: Secondary | ICD-10-CM | POA: Diagnosis not present

## 2022-09-07 DIAGNOSIS — Z9889 Other specified postprocedural states: Secondary | ICD-10-CM | POA: Diagnosis not present

## 2022-10-07 DIAGNOSIS — M810 Age-related osteoporosis without current pathological fracture: Secondary | ICD-10-CM | POA: Diagnosis not present

## 2022-10-27 DIAGNOSIS — M62838 Other muscle spasm: Secondary | ICD-10-CM | POA: Diagnosis not present

## 2022-11-02 DIAGNOSIS — Z139 Encounter for screening, unspecified: Secondary | ICD-10-CM | POA: Diagnosis not present

## 2022-11-02 DIAGNOSIS — Z9181 History of falling: Secondary | ICD-10-CM | POA: Diagnosis not present

## 2022-11-02 DIAGNOSIS — Z0000 Encounter for general adult medical examination without abnormal findings: Secondary | ICD-10-CM | POA: Diagnosis not present

## 2022-11-02 DIAGNOSIS — Z1331 Encounter for screening for depression: Secondary | ICD-10-CM | POA: Diagnosis not present

## 2022-11-02 DIAGNOSIS — Z1231 Encounter for screening mammogram for malignant neoplasm of breast: Secondary | ICD-10-CM | POA: Diagnosis not present

## 2022-12-03 DIAGNOSIS — R0981 Nasal congestion: Secondary | ICD-10-CM | POA: Diagnosis not present

## 2022-12-03 DIAGNOSIS — R070 Pain in throat: Secondary | ICD-10-CM | POA: Diagnosis not present

## 2022-12-03 DIAGNOSIS — J069 Acute upper respiratory infection, unspecified: Secondary | ICD-10-CM | POA: Diagnosis not present

## 2022-12-03 DIAGNOSIS — R509 Fever, unspecified: Secondary | ICD-10-CM | POA: Diagnosis not present

## 2022-12-14 DIAGNOSIS — R0981 Nasal congestion: Secondary | ICD-10-CM | POA: Diagnosis not present

## 2022-12-14 DIAGNOSIS — J069 Acute upper respiratory infection, unspecified: Secondary | ICD-10-CM | POA: Diagnosis not present

## 2022-12-19 DIAGNOSIS — J40 Bronchitis, not specified as acute or chronic: Secondary | ICD-10-CM | POA: Diagnosis not present

## 2023-01-04 DIAGNOSIS — J45909 Unspecified asthma, uncomplicated: Secondary | ICD-10-CM | POA: Diagnosis not present

## 2023-01-04 DIAGNOSIS — Z8709 Personal history of other diseases of the respiratory system: Secondary | ICD-10-CM | POA: Diagnosis not present

## 2023-01-11 DIAGNOSIS — J029 Acute pharyngitis, unspecified: Secondary | ICD-10-CM | POA: Diagnosis not present

## 2023-01-11 DIAGNOSIS — J40 Bronchitis, not specified as acute or chronic: Secondary | ICD-10-CM | POA: Diagnosis not present

## 2023-01-11 DIAGNOSIS — R6889 Other general symptoms and signs: Secondary | ICD-10-CM | POA: Diagnosis not present

## 2023-01-13 DIAGNOSIS — R0602 Shortness of breath: Secondary | ICD-10-CM | POA: Diagnosis not present

## 2023-01-14 DIAGNOSIS — R0602 Shortness of breath: Secondary | ICD-10-CM | POA: Diagnosis not present

## 2023-02-23 DIAGNOSIS — E2839 Other primary ovarian failure: Secondary | ICD-10-CM | POA: Diagnosis not present

## 2023-02-23 DIAGNOSIS — Z1231 Encounter for screening mammogram for malignant neoplasm of breast: Secondary | ICD-10-CM | POA: Diagnosis not present

## 2023-03-07 DIAGNOSIS — N1831 Chronic kidney disease, stage 3a: Secondary | ICD-10-CM | POA: Diagnosis not present

## 2023-03-07 DIAGNOSIS — E1169 Type 2 diabetes mellitus with other specified complication: Secondary | ICD-10-CM | POA: Diagnosis not present

## 2023-03-07 DIAGNOSIS — E782 Mixed hyperlipidemia: Secondary | ICD-10-CM | POA: Diagnosis not present

## 2023-03-09 DIAGNOSIS — K219 Gastro-esophageal reflux disease without esophagitis: Secondary | ICD-10-CM | POA: Diagnosis not present

## 2023-03-09 DIAGNOSIS — I10 Essential (primary) hypertension: Secondary | ICD-10-CM | POA: Diagnosis not present

## 2023-03-09 DIAGNOSIS — E782 Mixed hyperlipidemia: Secondary | ICD-10-CM | POA: Diagnosis not present

## 2023-03-09 DIAGNOSIS — M8580 Other specified disorders of bone density and structure, unspecified site: Secondary | ICD-10-CM | POA: Diagnosis not present

## 2023-03-09 DIAGNOSIS — R7303 Prediabetes: Secondary | ICD-10-CM | POA: Diagnosis not present

## 2023-03-14 DIAGNOSIS — Z1231 Encounter for screening mammogram for malignant neoplasm of breast: Secondary | ICD-10-CM | POA: Diagnosis not present

## 2023-03-16 DIAGNOSIS — H26493 Other secondary cataract, bilateral: Secondary | ICD-10-CM | POA: Diagnosis not present

## 2023-03-16 DIAGNOSIS — H43393 Other vitreous opacities, bilateral: Secondary | ICD-10-CM | POA: Diagnosis not present

## 2023-03-26 DIAGNOSIS — R0981 Nasal congestion: Secondary | ICD-10-CM | POA: Diagnosis not present

## 2023-03-26 DIAGNOSIS — R0600 Dyspnea, unspecified: Secondary | ICD-10-CM | POA: Diagnosis not present

## 2023-03-26 DIAGNOSIS — R051 Acute cough: Secondary | ICD-10-CM | POA: Diagnosis not present

## 2023-04-25 DIAGNOSIS — R351 Nocturia: Secondary | ICD-10-CM | POA: Diagnosis not present

## 2023-04-25 DIAGNOSIS — N3946 Mixed incontinence: Secondary | ICD-10-CM | POA: Diagnosis not present

## 2023-04-25 DIAGNOSIS — R350 Frequency of micturition: Secondary | ICD-10-CM | POA: Diagnosis not present

## 2023-05-01 DIAGNOSIS — M810 Age-related osteoporosis without current pathological fracture: Secondary | ICD-10-CM | POA: Diagnosis not present

## 2023-06-01 DIAGNOSIS — N3946 Mixed incontinence: Secondary | ICD-10-CM | POA: Diagnosis not present

## 2023-06-01 DIAGNOSIS — R350 Frequency of micturition: Secondary | ICD-10-CM | POA: Diagnosis not present

## 2023-06-04 DIAGNOSIS — R070 Pain in throat: Secondary | ICD-10-CM | POA: Diagnosis not present

## 2023-06-04 DIAGNOSIS — R0981 Nasal congestion: Secondary | ICD-10-CM | POA: Diagnosis not present

## 2023-06-04 DIAGNOSIS — R051 Acute cough: Secondary | ICD-10-CM | POA: Diagnosis not present

## 2023-06-04 DIAGNOSIS — R509 Fever, unspecified: Secondary | ICD-10-CM | POA: Diagnosis not present

## 2023-06-08 DIAGNOSIS — J40 Bronchitis, not specified as acute or chronic: Secondary | ICD-10-CM | POA: Diagnosis not present

## 2023-06-15 DIAGNOSIS — J329 Chronic sinusitis, unspecified: Secondary | ICD-10-CM | POA: Diagnosis not present

## 2023-06-15 DIAGNOSIS — J309 Allergic rhinitis, unspecified: Secondary | ICD-10-CM | POA: Diagnosis not present

## 2023-06-30 DIAGNOSIS — J328 Other chronic sinusitis: Secondary | ICD-10-CM | POA: Diagnosis not present

## 2023-07-01 DIAGNOSIS — J329 Chronic sinusitis, unspecified: Secondary | ICD-10-CM | POA: Diagnosis not present

## 2023-07-11 DIAGNOSIS — N3001 Acute cystitis with hematuria: Secondary | ICD-10-CM | POA: Diagnosis not present

## 2023-07-11 DIAGNOSIS — N3091 Cystitis, unspecified with hematuria: Secondary | ICD-10-CM | POA: Diagnosis not present

## 2023-07-12 ENCOUNTER — Encounter (INDEPENDENT_AMBULATORY_CARE_PROVIDER_SITE_OTHER): Payer: Self-pay | Admitting: Otolaryngology

## 2023-07-20 DIAGNOSIS — N3946 Mixed incontinence: Secondary | ICD-10-CM | POA: Diagnosis not present

## 2023-07-20 DIAGNOSIS — N3000 Acute cystitis without hematuria: Secondary | ICD-10-CM | POA: Diagnosis not present

## 2023-07-20 DIAGNOSIS — R350 Frequency of micturition: Secondary | ICD-10-CM | POA: Diagnosis not present

## 2023-07-24 DIAGNOSIS — R110 Nausea: Secondary | ICD-10-CM | POA: Diagnosis not present

## 2023-07-24 DIAGNOSIS — R1013 Epigastric pain: Secondary | ICD-10-CM | POA: Diagnosis not present

## 2023-07-24 DIAGNOSIS — K5900 Constipation, unspecified: Secondary | ICD-10-CM | POA: Diagnosis not present

## 2023-07-28 DIAGNOSIS — K5900 Constipation, unspecified: Secondary | ICD-10-CM | POA: Diagnosis not present

## 2023-07-28 DIAGNOSIS — K219 Gastro-esophageal reflux disease without esophagitis: Secondary | ICD-10-CM | POA: Diagnosis not present

## 2023-08-01 DIAGNOSIS — R3915 Urgency of urination: Secondary | ICD-10-CM | POA: Diagnosis not present

## 2023-08-01 DIAGNOSIS — N3091 Cystitis, unspecified with hematuria: Secondary | ICD-10-CM | POA: Diagnosis not present

## 2023-08-01 DIAGNOSIS — R1030 Lower abdominal pain, unspecified: Secondary | ICD-10-CM | POA: Diagnosis not present

## 2023-08-01 DIAGNOSIS — R350 Frequency of micturition: Secondary | ICD-10-CM | POA: Diagnosis not present

## 2023-08-07 DIAGNOSIS — J45909 Unspecified asthma, uncomplicated: Secondary | ICD-10-CM | POA: Diagnosis not present

## 2023-08-07 DIAGNOSIS — G479 Sleep disorder, unspecified: Secondary | ICD-10-CM | POA: Diagnosis not present

## 2023-08-11 DIAGNOSIS — R6889 Other general symptoms and signs: Secondary | ICD-10-CM | POA: Diagnosis not present

## 2023-08-11 DIAGNOSIS — R509 Fever, unspecified: Secondary | ICD-10-CM | POA: Diagnosis not present

## 2023-08-11 DIAGNOSIS — J329 Chronic sinusitis, unspecified: Secondary | ICD-10-CM | POA: Diagnosis not present

## 2023-08-16 DIAGNOSIS — R21 Rash and other nonspecific skin eruption: Secondary | ICD-10-CM | POA: Diagnosis not present

## 2023-08-19 DIAGNOSIS — H5711 Ocular pain, right eye: Secondary | ICD-10-CM | POA: Diagnosis not present

## 2023-08-19 DIAGNOSIS — H5712 Ocular pain, left eye: Secondary | ICD-10-CM | POA: Diagnosis not present

## 2023-08-21 ENCOUNTER — Ambulatory Visit: Admitting: Cardiology

## 2023-08-23 DIAGNOSIS — M62838 Other muscle spasm: Secondary | ICD-10-CM | POA: Diagnosis not present

## 2023-08-23 DIAGNOSIS — I959 Hypotension, unspecified: Secondary | ICD-10-CM | POA: Diagnosis not present

## 2023-09-06 DIAGNOSIS — F3181 Bipolar II disorder: Secondary | ICD-10-CM | POA: Diagnosis not present

## 2023-09-25 ENCOUNTER — Encounter (INDEPENDENT_AMBULATORY_CARE_PROVIDER_SITE_OTHER): Payer: Self-pay | Admitting: Otolaryngology

## 2023-09-25 ENCOUNTER — Ambulatory Visit (INDEPENDENT_AMBULATORY_CARE_PROVIDER_SITE_OTHER): Admitting: Otolaryngology

## 2023-09-25 VITALS — BP 115/74 | HR 88 | Ht 61.0 in | Wt 160.0 lb

## 2023-09-25 DIAGNOSIS — J328 Other chronic sinusitis: Secondary | ICD-10-CM

## 2023-09-25 DIAGNOSIS — J3089 Other allergic rhinitis: Secondary | ICD-10-CM

## 2023-09-25 DIAGNOSIS — J343 Hypertrophy of nasal turbinates: Secondary | ICD-10-CM | POA: Diagnosis not present

## 2023-09-25 DIAGNOSIS — R0981 Nasal congestion: Secondary | ICD-10-CM | POA: Diagnosis not present

## 2023-09-25 DIAGNOSIS — J3489 Other specified disorders of nose and nasal sinuses: Secondary | ICD-10-CM

## 2023-09-25 MED ORDER — AZELASTINE HCL 0.1 % NA SOLN: 2.0000 | Freq: Two times a day (BID) | NASAL | 12 refills | Status: DC

## 2023-09-25 NOTE — Progress Notes (Signed)
 Dear Dr. Orlando, Here is my assessment for our mutual patient, [name]. Thank you for allowing me the opportunity to care for your patient. Please do not hesitate to contact me should you have any other questions. Sincerely, Dr. Eldora Blanch  Otolaryngology Clinic Note  HISTORY: [name] is a 73 y.o. female kindly referred by Dr. Orlando for evaluation of sinus problems.   Initial visit (09/25/2023): She reports that she may have developed a nosocomial infection while her husband was admitted at [facility] in Sept 2024 with PNA. She was treated for this with multiple rounds of antibiotics (Amoxicillin , everything), did better in Nov/Dec and then again fell ill with Bronchitis. She reports that she subsequently developed sinusitis (for a good 5 months) -- again took multiple rounds of antibiotics (last was Doxy in April) and steroids. Symptoms included discolored drainage, headaches (frontal > maxillary) with pressure, fevers, nasal congestion. Since then, she reports that she has done better - very slight maxllary pressure and some congestion. Improvement occurred with abx/steroids.  She has tried afrin, flonase , saline spray, PO anthistamine.   She has a history of sinus infections but none recently except this bout.   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 Allergy testing has been done.   She is currently using no nasal medications except intermittent afrin use and allegra  and flonase  daily  ENT Surgery: Tonsillectomy, Sinus surgery x2 [facility] - last in 1992) and Septoplasty  AP/AC: no  Tobacco: no  PMHx: HTN, Arthritis, Pre-DM(?), CKD, Back Pain, OA  RADIOGRAPHIC EVALUATION AND INDEPENDENT REVIEW OF OTHER RECORDS:: Dwayne Orlando (07/04/2023) Referral notes reviewed and uploaded or available in chart in media tab - noted sinus problems - fatigue, headache, runny nose.  Started in Sept, has gotten abx, antihistamines, cough suppressants, saline spray; Dx: Sinusitis; Rx: ref to ENT, prednisone taper Labs reviewed: CBC (2025): WBC 9.1 CT Neck 07/21/2019 independently interpreted with respect to sinuses: evidence of b/l max antrostomies(?), no significant ethmoidectomy; septum dev right; no significant paranasal sinus disease Past Medical History:  Diagnosis Date   Anemia    Anxiety    Arthritis    Asthma    Cancer (HCC) 2002   Squamous Carcinoma   Depression    GERD (gastroesophageal reflux disease)    Heart murmur    Mitral Valve prolapse   History of kidney stones    Hypertension    Pneumonia    PONV (postoperative nausea and vomiting) 2003   Too much anesthesia; hard to wake up/coma; N/V   Pre-diabetes    Past Surgical History:  Procedure Laterality Date   ABDOMINAL HYSTERECTOMY     ANTERIOR CERVICAL DECOMP/DISCECTOMY FUSION N/A 07/19/2019   Procedure: Anterior Cerivcal Discectomy Fusion - Cervical Four-Cervical Five - Cervical Five- Cervical Six - Cervical Six- Cervical Seven;  Surgeon: Louis Shove, MD;  Location: [facility] OR;  Service: Neurosurgery;  Laterality: N/A;  Anterior Cerivcal Discectomy Fusion - Cervical Four-Cervical Five - Cervical Five- Cervical Six - Cervical Six- Cervical Seven   EYE SURGERY  2020   Cataract lens replacement   FRACTURE SURGERY Left 2003   Humerous fracture   JOINT REPLACEMENT     right and left knee replacement   kidney stone removed     TONSILLECTOMY     TOTAL KNEE REVISION Right 05/30/2016   Procedure: TOTAL KNEE REVISION;  Surgeon: Marcey Raman, MD;  Location: [facility] OR;  Service:  Orthopedics;  Laterality: Right;   WRIST ARTHROPLASTY Left    Family History  Problem Relation Age of Onset   Heart attack Brother    Social History   Tobacco Use   Smoking status: Never   Smokeless tobacco: Never  Substance Use Topics   Alcohol use: No   No Known Allergies Current Outpatient Medications  Medication Sig Dispense  Refill   albuterol  (PROVENTIL  HFA;VENTOLIN  HFA) 108 (90 BASE) MCG/ACT inhaler Inhale 2 puffs into the lungs every 6 (six) hours as needed for wheezing or shortness of breath.     amLODipine -benazepril  (LOTREL) 5-40 MG capsule Take 1 capsule by mouth at bedtime.      azelastine  (ASTELIN ) 0.1 % nasal spray Place 2 sprays into both nostrils 2 (two) times daily. Use in each nostril as directed 30 mL 12   Calcium Citrate-Vitamin D (CITRACAL + D PO) Take 400 mg by mouth daily with supper.     denosumab  (PROLIA ) 60 MG/ML SOLN injection Inject 60 mg into the skin every 6 (six) months.     famotidine  (PEPCID ) 40 MG tablet Take 40 mg by mouth at bedtime.      fexofenadine  (ALLEGRA ) 180 MG tablet TAKE 1 TABLET DAILY (Patient taking differently: Take 180 mg by mouth daily with supper.) 30 tablet 0   fluticasone  (FLONASE ) 50 MCG/ACT nasal spray Place 1 spray into both nostrils as needed for allergies or rhinitis. 16 g 4   gabapentin  (NEURONTIN ) 300 MG capsule Take 300 mg by mouth 2 (two) times daily as needed (nerve pain).      montelukast  (SINGULAIR ) 10 MG tablet Take 10 mg by mouth daily with supper.      Olopatadine HCl (PATADAY OP) Place 1 drop into both eyes daily.     polyvinyl alcohol (ARTIFICIAL TEARS) 1.4 % ophthalmic solution Place 1 drop into both eyes 2 (two) times daily.     aspirin  EC 81 MG tablet Take 81 mg by mouth daily. (Patient not taking: Reported on 09/25/2023)     busPIRone  (BUSPAR ) 15 MG tablet Take 15 mg by mouth 2 (two) times daily as needed (anxiety). (Patient not taking: Reported on 09/25/2023)     No current facility-administered medications for this visit.   BP 115/74 (BP Location: Left Arm, Patient Position: Sitting, Cuff Size: Large)   Pulse 88   Ht 5' 1 (1.549 m)   Wt 160 lb (72.6 kg)   SpO2 94%   BMI 30.23 kg/m   PHYSICAL EXAM:  BP 115/74 (BP Location: Left Arm, Patient Position: Sitting, Cuff Size: Large)   Pulse 88   Ht 5' 1 (1.549 m)   Wt 160 lb (72.6 kg)   SpO2  94%   BMI 30.23 kg/m    Salient findings:  CN II-XII intact Bilateral EAC clear and TM intact with well pneumatized middle ear spaces Nose: Anterior rhinoscopy reveals septum dev right, bilateral inferior turbinate hypertrophy.  Nasal endoscopy was indicated to better evaluate the nose and paranasal sinuses, given the patient's history and exam findings, and is detailed below. No lesions of oral cavity/oropharynx No respiratory distress or stridor   PROCEDURE:  Prior to initiating any procedures, risks/benefits/alternatives were explained to the patient and verbal consent obtained. Diagnostic Nasal Endoscopy Pre-procedure diagnosis: Concern for chronic sinusitis, nasal obstruction Post-procedure diagnosis: same Indication: See pre-procedure diagnosis and physical exam above Complications: None apparent EBL: 0 mL Anesthesia: Lidocaine  4% and topical decongestant was topically sprayed in each nasal cavity  Description of Procedure:  Patient was  identified. A rigid 30 degree endoscope was utilized to evaluate the sinonasal cavities, mucosa, sinus ostia and turbinates and septum.  Overall, signs of mucosal inflammation are not noted.  No mucopurulence, polyps, or masses noted.   Right Middle meatus: clear Right SE Recess: clear Left MM: clear Left SE Recess: clear Unable to see any large maxillary antrostomies  CPT CODE -- 31231 - Mod 25   ASSESSMENT:  73 y.o. with:  1. Other chronic sinusitis   2. Nasal obstruction   3. Hypertrophy of both inferior nasal turbinates   4. Nasal congestion   5. Seasonal allergic rhinitis due to other allergic trigger    H/o allergies with recent significant sinus exacerbation. Doing much better We've discussed issues and options today.  We reviewed the nasal endoscopy images together.  The risks, benefits and alternatives were discussed and questions answered.  She has elected to proceed with medical management for now. Will avoid post-treatment CT  given she is not having frequent exacerbations anymore  1) Flonase  BID and astelin  BID PRN, especially during exacerbations 2) Daily sinus rinses She will call if she has a recurrence  See below regarding exact medications prescribed this encounter including dosages and route: Meds ordered this encounter  Medications   azelastine  (ASTELIN ) 0.1 % nasal spray    Sig: Place 2 sprays into both nostrils 2 (two) times daily. Use in each nostril as directed    Dispense:  30 mL    Refill:  12     Thank you for allowing me the opportunity to care for your patient. Please do not hesitate to contact me should you have any other questions.  Sincerely, Eldora Blanch, MD Otolaryngologist (ENT), [facility] Phone: [phone] Fax: [phone]  MDM:  Level 4: [phone] Complexity/Problems addressed: low Data complexity: mod - independent interpretation of imaging - Morbidity: mod  - Drug prescribed or managed: y  09/25/2023, 1:40 PM

## 2023-09-25 NOTE — Patient Instructions (Addendum)
 If in sinus trouble: Use two sprays of flonase  in each nostril twice per day  right after, use astelin  spray two sprays each nostril twice per day;Do daily sinus rinses

## 2023-09-28 DIAGNOSIS — R051 Acute cough: Secondary | ICD-10-CM | POA: Diagnosis not present

## 2023-09-28 DIAGNOSIS — R0981 Nasal congestion: Secondary | ICD-10-CM | POA: Diagnosis not present

## 2023-09-28 DIAGNOSIS — R509 Fever, unspecified: Secondary | ICD-10-CM | POA: Diagnosis not present

## 2023-09-28 DIAGNOSIS — H9201 Otalgia, right ear: Secondary | ICD-10-CM | POA: Diagnosis not present

## 2023-10-02 DIAGNOSIS — E871 Hypo-osmolality and hyponatremia: Secondary | ICD-10-CM | POA: Diagnosis not present

## 2023-10-04 DIAGNOSIS — L719 Rosacea, unspecified: Secondary | ICD-10-CM | POA: Diagnosis not present

## 2023-10-04 DIAGNOSIS — R7303 Prediabetes: Secondary | ICD-10-CM | POA: Diagnosis not present

## 2023-10-04 DIAGNOSIS — I10 Essential (primary) hypertension: Secondary | ICD-10-CM | POA: Diagnosis not present

## 2023-10-04 DIAGNOSIS — E782 Mixed hyperlipidemia: Secondary | ICD-10-CM | POA: Diagnosis not present

## 2023-10-04 DIAGNOSIS — N182 Chronic kidney disease, stage 2 (mild): Secondary | ICD-10-CM | POA: Diagnosis not present

## 2023-10-04 DIAGNOSIS — T148XXA Other injury of unspecified body region, initial encounter: Secondary | ICD-10-CM | POA: Diagnosis not present

## 2023-10-17 DIAGNOSIS — L280 Lichen simplex chronicus: Secondary | ICD-10-CM | POA: Diagnosis not present

## 2023-10-17 DIAGNOSIS — L400 Psoriasis vulgaris: Secondary | ICD-10-CM | POA: Diagnosis not present

## 2023-10-23 DIAGNOSIS — R051 Acute cough: Secondary | ICD-10-CM | POA: Diagnosis not present

## 2023-10-23 DIAGNOSIS — R070 Pain in throat: Secondary | ICD-10-CM | POA: Diagnosis not present

## 2023-10-25 DIAGNOSIS — U071 COVID-19: Secondary | ICD-10-CM | POA: Diagnosis not present

## 2023-10-31 ENCOUNTER — Encounter (INDEPENDENT_AMBULATORY_CARE_PROVIDER_SITE_OTHER): Payer: Self-pay | Admitting: Otolaryngology

## 2023-10-31 ENCOUNTER — Ambulatory Visit (INDEPENDENT_AMBULATORY_CARE_PROVIDER_SITE_OTHER): Admitting: Otolaryngology

## 2023-10-31 VITALS — BP 139/68 | HR 86 | Ht 61.0 in

## 2023-10-31 DIAGNOSIS — J3489 Other specified disorders of nose and nasal sinuses: Secondary | ICD-10-CM | POA: Diagnosis not present

## 2023-10-31 DIAGNOSIS — J3089 Other allergic rhinitis: Secondary | ICD-10-CM | POA: Diagnosis not present

## 2023-10-31 DIAGNOSIS — J0181 Other acute recurrent sinusitis: Secondary | ICD-10-CM | POA: Diagnosis not present

## 2023-10-31 DIAGNOSIS — J343 Hypertrophy of nasal turbinates: Secondary | ICD-10-CM | POA: Diagnosis not present

## 2023-10-31 DIAGNOSIS — R0981 Nasal congestion: Secondary | ICD-10-CM

## 2023-10-31 MED ORDER — AZELASTINE HCL 0.1 % NA SOLN: 2.0000 | Freq: Two times a day (BID) | NASAL | 12 refills | Status: AC

## 2023-10-31 MED ORDER — DOXYCYCLINE HYCLATE 100 MG PO TABS: 100.0000 mg | ORAL_TABLET | Freq: Two times a day (BID) | ORAL | 0 refills | Status: AC

## 2023-10-31 MED ORDER — PREDNISONE 10 MG PO TABS: 10.0000 mg | ORAL_TABLET | Freq: Every day | ORAL | 0 refills | Status: AC

## 2023-10-31 MED ORDER — FLONASE SENSIMIST 27.5 MCG/SPRAY NA SUSP: 2.0000 | Freq: Two times a day (BID) | NASAL | 12 refills | Status: AC

## 2023-10-31 NOTE — Patient Instructions (Addendum)
 Use two sprays of flonase  in each nostril twice per day  right after, use astelin  spray two sprays each nostril twice per day  Use doxycycline  twice daily for 10 days Use prednisone  10mg  tablet daily for 7 days  Aureliano Med Nasal Saline Rinse - use daily - start nasal saline rinses with NeilMed Bottle available over the counter    Nasal Saline Irrigation instructions: If you choose to make your own salt water solution, You will need: Salt (kosher, canning, or pickling salt) Baking soda Nasal irrigation bottle (i.e. Aureliano Med Sinus Rinse) Measuring spoon ( teaspoon) Distilled / boiled water   Mix solution Mix 1 teaspoon of salt, 1/2 teaspoon of baking soda and 1 cup of water into irrigation bottle ** May use saline packet instead of homemade recipe for this step if you prefer If medicine was prescribed to be mixed with solution, place this into bottle Examples 2 inches of 2% mupirocin ointment Budesonide  solution Position your head: Lean over sink (about 45 degrees) Rotate head (about 45 degrees) so that one nostril is above the other Irrigate Insert tip of irrigation bottle into upper nostril so it forms a comfortable seal Irrigate while breathing through your mouth May remove the straw from the bottle in order to irrigate the entire solution (important if medicine was added) Exhale through nose when finished and blow nose as necessary  Repeat on opposite side with other 1/2 of solution (120 mL) or remake solution if all 240 mL was used on first side Wash irrigation bottle regularly, replace every 3 months

## 2023-10-31 NOTE — Progress Notes (Signed)
 Dear Dr. Stephanie, Here is my assessment for our mutual patient, Prerana Strayer. Thank you for allowing me the opportunity to care for your patient. Please do not hesitate to contact me should you have any other questions. Sincerely, Dr. Eldora Blanch  Otolaryngology Clinic Note  HISTORY: [name] is a 73 y.o. female kindly referred by Dr. Stephanie for evaluation of sinus problems.   Initial visit (09/25/2023): She reports that she may have developed a nosocomial infection while her husband was admitted at [facility] in Sept 2024 with PNA. She was treated for this with multiple rounds of antibiotics (Amoxicillin , everything), did better in Nov/Dec and then again fell ill with Bronchitis. She reports that she subsequently developed sinusitis (for a good 5 months) -- again took multiple rounds of antibiotics (last was Doxy in April) and steroids. Symptoms included discolored drainage, headaches (frontal > maxillary) with pressure, fevers, nasal congestion. Since then, she reports that she has done better - very slight maxllary pressure and some congestion. Improvement occurred with abx/steroids.  She has tried afrin, flonase , saline spray, PO anthistamine.   She has a history of sinus infections but none recently except this bout.   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 Allergy testing has been done.   She is currently using no nasal medications except intermittent afrin use and allegra  and flonase  daily  --------------------------------------------------------- 10/31/2023 She seen in follow up. She reports that she contracted COVID and is concerned that she will develop chronic sinusitis. Currently, she reports some thick nasal drainage, bilateral facial pressure, no fevers. She is overall feeling some better than when she had the URI.  She is not using irrigations, and not using nasal sprays.    -------------------------------------------------------  ENT Surgery: Tonsillectomy, Sinus surgery x2 [facility] - last in 1992) and Septoplasty  AP/AC: no  Tobacco: no  PMHx: HTN, Arthritis, Pre-DM(?), CKD, Back Pain, OA  RADIOGRAPHIC EVALUATION AND INDEPENDENT REVIEW OF OTHER RECORDS:: Dwayne Ellen (07/04/2023) Referral notes reviewed and uploaded or available in chart in media tab - noted sinus problems - fatigue, headache, runny nose. Started in Sept, has gotten abx, antihistamines, cough suppressants, saline spray; Dx: Sinusitis; Rx: ref to ENT, prednisone  taper Labs reviewed: CBC (2025): WBC 9.1 CT Neck 07/21/2019 independently interpreted with respect to sinuses: evidence of b/l max antrostomies(?), no significant ethmoidectomy; septum dev right; no significant paranasal sinus disease Past Medical History:  Diagnosis Date   Anemia    Anxiety    Arthritis    Asthma    Cancer (HCC) 2002   Squamous Carcinoma   Depression    GERD (gastroesophageal reflux disease)    Heart murmur    Mitral Valve prolapse   History of kidney stones    Hypertension    Pneumonia    PONV (postoperative nausea and vomiting) 2003   Too much anesthesia; hard to wake up/coma; N/V   Pre-diabetes    Past Surgical History:  Procedure Laterality Date   ABDOMINAL HYSTERECTOMY     ANTERIOR CERVICAL DECOMP/DISCECTOMY FUSION N/A 07/19/2019   Procedure: Anterior Cerivcal Discectomy Fusion - Cervical Four-Cervical Five - Cervical Five- Cervical Six - Cervical Six- Cervical Seven;  Surgeon: Louis Shove, MD;  Location: [facility] OR;  Service: Neurosurgery;  Laterality: N/A;  Anterior Cerivcal Discectomy Fusion - Cervical Four-Cervical Five - Cervical Five- Cervical Six - Cervical Six- Cervical Seven   EYE SURGERY  2020   Cataract  lens replacement   FRACTURE SURGERY Left 2003   Humerous fracture   JOINT REPLACEMENT     right and left knee replacement   kidney stone removed     TONSILLECTOMY      TOTAL KNEE REVISION Right 05/30/2016   Procedure: TOTAL KNEE REVISION;  Surgeon: Marcey Raman, MD;  Location: [facility] OR;  Service: Orthopedics;  Laterality: Right;   WRIST ARTHROPLASTY Left    Family History  Problem Relation Age of Onset   Heart attack Brother    Social History   Tobacco Use   Smoking status: Never   Smokeless tobacco: Never  Substance Use Topics   Alcohol use: No   No Known Allergies Current Outpatient Medications  Medication Sig Dispense Refill   albuterol  (PROVENTIL  HFA;VENTOLIN  HFA) 108 (90 BASE) MCG/ACT inhaler Inhale 2 puffs into the lungs every 6 (six) hours as needed for wheezing or shortness of breath.     amLODipine -benazepril  (LOTREL) 5-40 MG capsule Take 1 capsule by mouth at bedtime.      Calcium Citrate-Vitamin D (CITRACAL + D PO) Take 400 mg by mouth daily with supper.     denosumab  (PROLIA ) 60 MG/ML SOLN injection Inject 60 mg into the skin every 6 (six) months.     doxycycline  (VIBRA -TABS) 100 MG tablet Take 1 tablet (100 mg total) by mouth 2 (two) times daily for 10 days. 20 tablet 0   erythromycin ophthalmic ointment erythromycin 0.5% ophthalmic ointment Start Date: 12/03/19 Status: Ordered Repeat number: 1     famotidine  (PEPCID ) 40 MG tablet Take 40 mg by mouth at bedtime.      fexofenadine  (ALLEGRA ) 180 MG tablet TAKE 1 TABLET DAILY (Patient taking differently: Take 180 mg by mouth daily with supper.) 30 tablet 0   FLUoxetine  (PROZAC ) 40 MG capsule FLUoxetine  40 mg oral capsule Start Date: 10/17/19 Status: Ordered Repeat number: 1     fluticasone  (FLONASE  SENSIMIST) 27.5 MCG/SPRAY nasal spray Place 2 sprays into the nose in the morning and at bedtime. 10 g 12   fluticasone  (FLONASE ) 50 MCG/ACT nasal spray Place 1 spray into both nostrils as needed for allergies or rhinitis. 16 g 4   gabapentin  (NEURONTIN ) 300 MG capsule Take 300 mg by mouth 2 (two) times daily as needed (nerve pain).      montelukast  (SINGULAIR ) 10 MG tablet Take 10 mg by mouth  daily with supper.      Olopatadine HCl (PATADAY OP) Place 1 drop into both eyes daily.     polyvinyl alcohol (ARTIFICIAL TEARS) 1.4 % ophthalmic solution Place 1 drop into both eyes 2 (two) times daily.     predniSONE  (DELTASONE ) 10 MG tablet Take 1 tablet (10 mg total) by mouth daily with breakfast for 7 days. 7 tablet 0   aspirin  EC 81 MG tablet Take 81 mg by mouth daily. (Patient not taking: Reported on 10/31/2023)     azelastine  (ASTELIN ) 0.1 % nasal spray Place 2 sprays into both nostrils 2 (two) times daily. Use in each nostril as directed 30 mL 12   busPIRone  (BUSPAR ) 15 MG tablet Take 15 mg by mouth 2 (two) times daily as needed (anxiety). (Patient not taking: Reported on 10/31/2023)     No current facility-administered medications for this visit.   BP 139/68 (BP Location: Left Arm, Patient Position: Sitting, Cuff Size: Large)   Pulse 86   Ht 5' 1 (1.549 m)   SpO2 96%   BMI 30.23 kg/m   PHYSICAL EXAM:  BP 139/68 (BP Location:  Left Arm, Patient Position: Sitting, Cuff Size: Large)   Pulse 86   Ht 5' 1 (1.549 m)   SpO2 96%   BMI 30.23 kg/m    Salient findings:  CN II-XII intact Bilateral EAC clear and TM intact with well pneumatized middle ear spaces Nose: Anterior rhinoscopy reveals septum dev right, bilateral inferior turbinate hypertrophy.  Nasal endoscopy was indicated to better evaluate the nose and paranasal sinuses, given the patient's history and exam findings, and is detailed below. No lesions of oral cavity/oropharynx No respiratory distress or stridor   PROCEDURE:  Prior to initiating any procedures, risks/benefits/alternatives were explained to the patient and verbal consent obtained. Diagnostic Nasal Endoscopy Pre-procedure diagnosis: Concern for sinusitis Post-procedure diagnosis: same Indication: See pre-procedure diagnosis and physical exam above Complications: None apparent EBL: 0 mL Anesthesia: Lidocaine  4% and topical decongestant was topically  sprayed in each nasal cavity  Description of Procedure:  Patient was identified. A rigid 30 degree endoscope was utilized to evaluate the sinonasal cavities, mucosa, sinus ostia and turbinates and septum.  Overall, signs of mucosal inflammation are noted.  No polyps, or masses noted.   Right Middle meatus: small amount of purulent secretions around axilla of MT Right SE Recess: clear Left MM: clear Left SE Recess: clear Unable to see any large maxillary antrostomies  CPT CODE -- 31231 - Mod 25   ASSESSMENT:  73 y.o. with:  1. Hypertrophy of both inferior nasal turbinates   2. Nasal obstruction   3. Seasonal allergic rhinitis due to other allergic trigger   4. Nasal congestion   5. Other acute recurrent sinusitis    H/o allergies with recent significant sinus exacerbation with resolution of symptoms at last visit but now is having recurrence of symptoms with some purulence on endo.  We've discussed issues and options today.  We reviewed the nasal endoscopy images together.  The risks, benefits and alternatives were discussed and questions answered.  She has elected to proceed with medical management for now. Will avoid post-treatment CT given she is not having frequent exacerbations but if these continue, will opt for CT  1) Not using sprays so encouraged regular Flonase  BID and astelin  BID PRN, especially during exacerbations 2) Daily sinus rinses 3) Doxycycline  and prednisone  burst She will call if she is not improving  See below regarding exact medications prescribed this encounter including dosages and route: Meds ordered this encounter  Medications   doxycycline  (VIBRA -TABS) 100 MG tablet    Sig: Take 1 tablet (100 mg total) by mouth 2 (two) times daily for 10 days.    Dispense:  20 tablet    Refill:  0   predniSONE  (DELTASONE ) 10 MG tablet    Sig: Take 1 tablet (10 mg total) by mouth daily with breakfast for 7 days.    Dispense:  7 tablet    Refill:  0   fluticasone   (FLONASE  SENSIMIST) 27.5 MCG/SPRAY nasal spray    Sig: Place 2 sprays into the nose in the morning and at bedtime.    Dispense:  10 g    Refill:  12   azelastine  (ASTELIN ) 0.1 % nasal spray    Sig: Place 2 sprays into both nostrils 2 (two) times daily. Use in each nostril as directed    Dispense:  30 mL    Refill:  12     Thank you for allowing me the opportunity to care for your patient. Please do not hesitate to contact me should you have any other questions.  Sincerely, Eldora Blanch, MD Otolaryngologist (ENT), [facility] Phone: [phone] Fax: [phone]  MDM:  Level 4: 99214 Complexity/Problems addressed: mod - chronic problem with exacerbation, acute problem Data complexity: low currently - Morbidity: mod  - Drug prescribed or managed: y  10/31/2023, 12:51 PM

## 2023-11-10 ENCOUNTER — Ambulatory Visit: Admitting: Cardiology

## 2023-11-24 DIAGNOSIS — M810 Age-related osteoporosis without current pathological fracture: Secondary | ICD-10-CM | POA: Diagnosis not present

## 2023-11-28 DIAGNOSIS — L280 Lichen simplex chronicus: Secondary | ICD-10-CM | POA: Diagnosis not present

## 2023-11-28 DIAGNOSIS — L400 Psoriasis vulgaris: Secondary | ICD-10-CM | POA: Diagnosis not present

## 2023-12-31 DIAGNOSIS — R300 Dysuria: Secondary | ICD-10-CM | POA: Diagnosis not present

## 2024-01-08 DIAGNOSIS — R300 Dysuria: Secondary | ICD-10-CM | POA: Diagnosis not present

## 2024-01-15 DIAGNOSIS — M791 Myalgia, unspecified site: Secondary | ICD-10-CM | POA: Diagnosis not present

## 2024-01-15 DIAGNOSIS — Z1330 Encounter for screening examination for mental health and behavioral disorders, unspecified: Secondary | ICD-10-CM | POA: Diagnosis not present

## 2024-01-15 DIAGNOSIS — M545 Low back pain, unspecified: Secondary | ICD-10-CM | POA: Diagnosis not present

## 2024-01-15 DIAGNOSIS — M47816 Spondylosis without myelopathy or radiculopathy, lumbar region: Secondary | ICD-10-CM | POA: Diagnosis not present

## 2024-01-16 DIAGNOSIS — L4050 Arthropathic psoriasis, unspecified: Secondary | ICD-10-CM | POA: Diagnosis not present

## 2024-01-16 DIAGNOSIS — L400 Psoriasis vulgaris: Secondary | ICD-10-CM | POA: Diagnosis not present

## 2024-01-23 DIAGNOSIS — M2022 Hallux rigidus, left foot: Secondary | ICD-10-CM | POA: Diagnosis not present

## 2024-01-23 DIAGNOSIS — R208 Other disturbances of skin sensation: Secondary | ICD-10-CM | POA: Diagnosis not present

## 2024-01-23 DIAGNOSIS — L84 Corns and callosities: Secondary | ICD-10-CM | POA: Diagnosis not present

## 2024-01-23 DIAGNOSIS — M2021 Hallux rigidus, right foot: Secondary | ICD-10-CM | POA: Diagnosis not present

## 2024-01-23 DIAGNOSIS — L4050 Arthropathic psoriasis, unspecified: Secondary | ICD-10-CM | POA: Diagnosis not present

## 2024-01-23 DIAGNOSIS — L400 Psoriasis vulgaris: Secondary | ICD-10-CM | POA: Diagnosis not present

## 2024-01-25 ENCOUNTER — Ambulatory Visit: Admitting: Cardiology
